# Patient Record
Sex: Male | Born: 1940 | Race: White | Hispanic: No | Marital: Married | Smoking: Former smoker
Health system: Southern US, Community
[De-identification: ages and names within clinical notes are randomized; demographics above are authoritative.]

## PROBLEM LIST (undated history)

## (undated) DIAGNOSIS — F32A Depression, unspecified: Secondary | ICD-10-CM

## (undated) DIAGNOSIS — G959 Disease of spinal cord, unspecified: Secondary | ICD-10-CM

## (undated) DIAGNOSIS — G4730 Sleep apnea, unspecified: Secondary | ICD-10-CM

## (undated) DIAGNOSIS — I499 Cardiac arrhythmia, unspecified: Secondary | ICD-10-CM

## (undated) DIAGNOSIS — E119 Type 2 diabetes mellitus without complications: Secondary | ICD-10-CM

## (undated) DIAGNOSIS — C801 Malignant (primary) neoplasm, unspecified: Secondary | ICD-10-CM

## (undated) DIAGNOSIS — M549 Dorsalgia, unspecified: Secondary | ICD-10-CM

## (undated) DIAGNOSIS — Z9889 Other specified postprocedural states: Secondary | ICD-10-CM

## (undated) DIAGNOSIS — M4712 Other spondylosis with myelopathy, cervical region: Secondary | ICD-10-CM

## (undated) DIAGNOSIS — J159 Unspecified bacterial pneumonia: Secondary | ICD-10-CM

## (undated) DIAGNOSIS — K219 Gastro-esophageal reflux disease without esophagitis: Secondary | ICD-10-CM

## (undated) DIAGNOSIS — I519 Heart disease, unspecified: Secondary | ICD-10-CM

## (undated) DIAGNOSIS — R4182 Altered mental status, unspecified: Secondary | ICD-10-CM

## (undated) DIAGNOSIS — R29898 Other symptoms and signs involving the musculoskeletal system: Secondary | ICD-10-CM

## (undated) DIAGNOSIS — J9621 Acute and chronic respiratory failure with hypoxia: Secondary | ICD-10-CM

## (undated) DIAGNOSIS — R519 Headache, unspecified: Secondary | ICD-10-CM

## (undated) DIAGNOSIS — I4891 Unspecified atrial fibrillation: Secondary | ICD-10-CM

## (undated) DIAGNOSIS — R112 Nausea with vomiting, unspecified: Secondary | ICD-10-CM

## (undated) DIAGNOSIS — F419 Anxiety disorder, unspecified: Secondary | ICD-10-CM

## (undated) DIAGNOSIS — I10 Essential (primary) hypertension: Secondary | ICD-10-CM

## (undated) DIAGNOSIS — M5412 Radiculopathy, cervical region: Secondary | ICD-10-CM

## (undated) DIAGNOSIS — I2510 Atherosclerotic heart disease of native coronary artery without angina pectoris: Secondary | ICD-10-CM

## (undated) DIAGNOSIS — F329 Major depressive disorder, single episode, unspecified: Secondary | ICD-10-CM

## (undated) DIAGNOSIS — R51 Headache: Secondary | ICD-10-CM

## (undated) DIAGNOSIS — G8929 Other chronic pain: Secondary | ICD-10-CM

## (undated) DIAGNOSIS — I482 Chronic atrial fibrillation, unspecified: Secondary | ICD-10-CM

## (undated) HISTORY — DX: Other chronic pain: G89.29

## (undated) HISTORY — PX: JOINT REPLACEMENT: SHX530

## (undated) HISTORY — PX: FRACTURE SURGERY: SHX138

## (undated) HISTORY — PX: OTHER SURGICAL HISTORY: SHX169

## (undated) HISTORY — DX: Dorsalgia, unspecified: M54.9

## (undated) HISTORY — PX: EYE SURGERY: SHX253

## (undated) HISTORY — PX: SHOULDER SURGERY: SHX246

## (undated) HISTORY — PX: CHOLECYSTECTOMY: SHX55

## (undated) HISTORY — DX: Depression, unspecified: F32.A

## (undated) HISTORY — PX: HERNIA REPAIR: SHX51

## (undated) HISTORY — PX: TONSILLECTOMY: SUR1361

## (undated) HISTORY — PX: APPENDECTOMY: SHX54

## (undated) HISTORY — DX: Unspecified atrial fibrillation: I48.91

## (undated) HISTORY — DX: Major depressive disorder, single episode, unspecified: F32.9

## (undated) HISTORY — DX: Other symptoms and signs involving the musculoskeletal system: R29.898

## (undated) HISTORY — DX: Heart disease, unspecified: I51.9

---

## 2014-03-16 DIAGNOSIS — M179 Osteoarthritis of knee, unspecified: Secondary | ICD-10-CM | POA: Insufficient documentation

## 2014-03-16 DIAGNOSIS — M5416 Radiculopathy, lumbar region: Secondary | ICD-10-CM | POA: Insufficient documentation

## 2014-03-16 DIAGNOSIS — M1710 Unilateral primary osteoarthritis, unspecified knee: Secondary | ICD-10-CM | POA: Insufficient documentation

## 2014-03-16 DIAGNOSIS — M5136 Other intervertebral disc degeneration, lumbar region: Secondary | ICD-10-CM | POA: Insufficient documentation

## 2014-03-16 DIAGNOSIS — M4310 Spondylolisthesis, site unspecified: Secondary | ICD-10-CM | POA: Insufficient documentation

## 2014-03-16 DIAGNOSIS — M47817 Spondylosis without myelopathy or radiculopathy, lumbosacral region: Secondary | ICD-10-CM | POA: Insufficient documentation

## 2014-03-16 DIAGNOSIS — M1610 Unilateral primary osteoarthritis, unspecified hip: Secondary | ICD-10-CM | POA: Insufficient documentation

## 2014-03-16 DIAGNOSIS — M545 Low back pain, unspecified: Secondary | ICD-10-CM | POA: Insufficient documentation

## 2014-03-16 DIAGNOSIS — M659 Synovitis and tenosynovitis, unspecified: Secondary | ICD-10-CM | POA: Insufficient documentation

## 2014-03-16 DIAGNOSIS — S93629A Sprain of tarsometatarsal ligament of unspecified foot, initial encounter: Secondary | ICD-10-CM | POA: Insufficient documentation

## 2014-03-16 DIAGNOSIS — I10 Essential (primary) hypertension: Secondary | ICD-10-CM | POA: Insufficient documentation

## 2014-03-16 DIAGNOSIS — Z9889 Other specified postprocedural states: Secondary | ICD-10-CM | POA: Insufficient documentation

## 2014-12-15 HISTORY — PX: SPINAL FUSION: SHX223

## 2014-12-25 ENCOUNTER — Other Ambulatory Visit: Payer: Self-pay | Admitting: Neurosurgery

## 2014-12-29 NOTE — Pre-Procedure Instructions (Signed)
OGDEN MALINA  12/29/2014   Your procedure is scheduled on: Tuesday, March 22nd   Report to [facility] Admitting at 9:30  AM.   Call this number if you have problems the morning of surgery: [phone]   Remember:   Do not eat food or drink liquids after midnight Monday.   Take these medicines the morning of surgery with A SIP OF WATER: Carvedilol, Prozac.              DO NOT TAKE your diabetes medication that morning.   Do not wear jewelry - no rings or watches.  Do not wear lotions or colognes.   You may NOT wear deodorant the day of surgery.   Men may shave face and neck.   Do not bring valuables to the hospital.  [facility] is not responsible for any belongings or valuables.               Contacts, dentures or bridgework may not be worn into surgery.  Leave suitcase in the car. After surgery it may be brought to your room.  For patients admitted to the hospital, discharge time is determined by your treatment team.    Name and phone number of your driver:    Special Instructions: "Preparing for Surgery" instruction sheet.   Please read over the following fact sheets that you were given: Pain Booklet, Coughing and Deep Breathing, Blood Transfusion Information, MRSA Information and Surgical Site Infection Prevention

## 2014-12-30 ENCOUNTER — Ambulatory Visit (HOSPITAL_COMMUNITY)
Admission: RE | Admit: 2014-12-30 | Discharge: 2014-12-30 | Disposition: A | Discharge status: Home / Self Care | Payer: Medicare Other | Source: Ambulatory Visit | Attending: Anesthesiology | Admitting: Anesthesiology

## 2014-12-30 ENCOUNTER — Encounter (HOSPITAL_COMMUNITY): Payer: Self-pay

## 2014-12-30 ENCOUNTER — Encounter (HOSPITAL_COMMUNITY)
Admission: RE | Admit: 2014-12-30 | Discharge: 2014-12-30 | Disposition: A | Discharge status: Home / Self Care | Payer: Medicare Other | Source: Ambulatory Visit | Attending: Neurosurgery | Admitting: Neurosurgery

## 2014-12-30 DIAGNOSIS — Z87891 Personal history of nicotine dependence: Secondary | ICD-10-CM | POA: Insufficient documentation

## 2014-12-30 DIAGNOSIS — M5134 Other intervertebral disc degeneration, thoracic region: Secondary | ICD-10-CM | POA: Insufficient documentation

## 2014-12-30 DIAGNOSIS — K219 Gastro-esophageal reflux disease without esophagitis: Secondary | ICD-10-CM | POA: Diagnosis not present

## 2014-12-30 DIAGNOSIS — M4310 Spondylolisthesis, site unspecified: Secondary | ICD-10-CM | POA: Diagnosis not present

## 2014-12-30 DIAGNOSIS — Z01818 Encounter for other preprocedural examination: Secondary | ICD-10-CM | POA: Insufficient documentation

## 2014-12-30 DIAGNOSIS — Z0183 Encounter for blood typing: Secondary | ICD-10-CM | POA: Diagnosis not present

## 2014-12-30 DIAGNOSIS — I10 Essential (primary) hypertension: Secondary | ICD-10-CM | POA: Insufficient documentation

## 2014-12-30 DIAGNOSIS — E119 Type 2 diabetes mellitus without complications: Secondary | ICD-10-CM | POA: Diagnosis not present

## 2014-12-30 DIAGNOSIS — I2510 Atherosclerotic heart disease of native coronary artery without angina pectoris: Secondary | ICD-10-CM

## 2014-12-30 DIAGNOSIS — Z01812 Encounter for preprocedural laboratory examination: Secondary | ICD-10-CM | POA: Diagnosis not present

## 2014-12-30 DIAGNOSIS — M419 Scoliosis, unspecified: Secondary | ICD-10-CM | POA: Insufficient documentation

## 2014-12-30 HISTORY — DX: Type 2 diabetes mellitus without complications: E11.9

## 2014-12-30 HISTORY — DX: Cardiac arrhythmia, unspecified: I49.9

## 2014-12-30 HISTORY — DX: Other specified postprocedural states: Z98.890

## 2014-12-30 HISTORY — DX: Nausea with vomiting, unspecified: R11.2

## 2014-12-30 HISTORY — DX: Atherosclerotic heart disease of native coronary artery without angina pectoris: I25.10

## 2014-12-30 HISTORY — DX: Malignant (primary) neoplasm, unspecified: C80.1

## 2014-12-30 HISTORY — DX: Gastro-esophageal reflux disease without esophagitis: K21.9

## 2014-12-30 HISTORY — DX: Essential (primary) hypertension: I10

## 2014-12-30 HISTORY — DX: Headache, unspecified: R51.9

## 2014-12-30 HISTORY — DX: Headache: R51

## 2014-12-30 HISTORY — DX: Anxiety disorder, unspecified: F41.9

## 2014-12-30 HISTORY — DX: Sleep apnea, unspecified: G47.30

## 2014-12-30 LAB — CBC
HCT: 41.6 % (ref 39.0–52.0)
Hemoglobin: 14.1 g/dL (ref 13.0–17.0)
MCH: 29.1 pg (ref 26.0–34.0)
MCHC: 33.9 g/dL (ref 30.0–36.0)
MCV: 85.8 fL (ref 78.0–100.0)
Platelets: 210 10*3/uL (ref 150–400)
RBC: 4.85 MIL/uL (ref 4.22–5.81)
RDW: 14.5 % (ref 11.5–15.5)
WBC: 9.1 10*3/uL (ref 4.0–10.5)

## 2014-12-30 LAB — SURGICAL PCR SCREEN
MRSA, PCR: NEGATIVE
STAPHYLOCOCCUS AUREUS: NEGATIVE

## 2014-12-30 LAB — BASIC METABOLIC PANEL
Anion gap: 10 (ref 5–15)
BUN: 13 mg/dL (ref 6–23)
CO2: 22 mmol/L (ref 19–32)
Calcium: 9.3 mg/dL (ref 8.4–10.5)
Chloride: 104 mmol/L (ref 96–112)
Creatinine, Ser: 0.99 mg/dL (ref 0.50–1.35)
GFR calc Af Amer: >90 mL/min (ref >90)
GFR calc non Af Amer: 80 mL/min — ABNORMAL LOW (ref >90)
GLUCOSE: 94 mg/dL (ref 70–99)
Potassium: 4.2 mmol/L (ref 3.5–5.1)
SODIUM: 136 mmol/L (ref 135–145)

## 2014-12-30 LAB — TYPE AND SCREEN
ABO/RH(D): O POS
ANTIBODY SCREEN: NEGATIVE

## 2014-12-30 LAB — ABO/RH: ABO/RH(D): O POS

## 2014-12-30 NOTE — Progress Notes (Addendum)
He sees a Dr. Claudie Leach over at [facility] in [facility].[phone])   LOV was end of Feb, first of march.  Had Echo, EkG, stress, doppler on legs and carotids. Have called Dr. De Blanch' office for this info.  Lorriane Shire at the office said they hadn't received it yet, so I have sent them a request. He also had sleep study 2 yrs ago @ [facility] and I have requested this report too. (His PCP is actually a PA by the name of [facility] @ [facility]).  DA Had right eye removed d/t cancer--wears prosthesis most all the time, even with his previous surgeries.  DA

## 2014-12-31 ENCOUNTER — Encounter (HOSPITAL_COMMUNITY): Payer: Self-pay

## 2014-12-31 NOTE — Progress Notes (Addendum)
Anesthesia Chart Review:  Patient is a 74 year old male scheduled for L5-S1 PLIF on 01/05/15 by Dr. Hal Neer.  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  Cardiologist is Dr. Claudie Leach with [facility].  He did not recommend any additional preoperative testing.  Meds include benazepril, Coreg, Prozac, HCTZ, Mg, metformin, Pravachol.  EKG pending.   By notes, recent telemetry monitor showed no afib/arrhythmias/heart block. No further dizziness off amlodipine.  12/30/14 CXR: Enlargement of cardiac silhouette. Minimal bronchitic changes without infiltrate.  Preoperative labs noted.    Clearance on chart, but awaiting additional cardiology records.  Chart will be left for follow-up.  George Hugh [facility]/Anesthesiology Phone [phone] 12/31/2014 5:31 PM  Addendum: Records received from [facility]. EKG is nearly impossible to read due to darkening. Interpretation seem to read as junctional rhythm with non-specific T wave abnormality.  I think I see at least some p waves but cannot be for certain due to the poor quality of the EKG faxed copy. I also cannot locate a date on the tracings sent. For these reasons, I think he will need an EKG on the day of surgery for a better baseline.  12/02/04 Echo: LV size, wall thickness and systolic function are normal. The diastolic filling pattern is normal for the age of the patient. The LA is markedly dilated. There is no evidence of pulmonary hypertension. Otherwise normal cardiac chamber sizes and function, normal valve anatomy and function, no pericardial effusion or intracardiac mass. Normal thoracic aorta and aortic arch.     Notes indicate that his last stress test was on 11/09/11, but no report was sent.  Because test was > 3 years ago and he has cardiac clearance, I will not pursue further.    If not acute changes then I would anticipate that he could proceed as planned.  George Hugh [facility] Short Stay Center/Anesthesiology Phone [phone] 01/01/2015 10:51 AM

## 2015-01-04 MED ORDER — DEXAMETHASONE SODIUM PHOSPHATE 10 MG/ML IJ SOLN
10.0000 mg | INTRAMUSCULAR | Status: AC
  Administered 2015-01-05: 10 mg via INTRAVENOUS
  Filled 2015-01-04: qty 1

## 2015-01-04 MED ORDER — CEFAZOLIN SODIUM-DEXTROSE 2-3 GM-% IV SOLR
2.0000 g | INTRAVENOUS | Status: AC
  Administered 2015-01-05: 2 g via INTRAVENOUS
  Filled 2015-01-04: qty 50

## 2015-01-04 NOTE — Progress Notes (Signed)
Left message for patient to arrive at 0730

## 2015-01-05 ENCOUNTER — Inpatient Hospital Stay (HOSPITAL_COMMUNITY): Payer: Medicare Other | Admitting: Vascular Surgery

## 2015-01-05 ENCOUNTER — Inpatient Hospital Stay (HOSPITAL_COMMUNITY)
Admission: RE | Admit: 2015-01-05 | Discharge: 2015-01-11 | DRG: 460 | Disposition: A | Discharge status: Home Health | Payer: Medicare Other | Source: Ambulatory Visit | Attending: Neurosurgery | Admitting: Neurosurgery

## 2015-01-05 ENCOUNTER — Inpatient Hospital Stay (HOSPITAL_COMMUNITY): Payer: Medicare Other | Admitting: Certified Registered"

## 2015-01-05 ENCOUNTER — Encounter (HOSPITAL_COMMUNITY)
Admission: RE | Disposition: A | Discharge status: Home Health | Payer: Self-pay | Source: Ambulatory Visit | Attending: Neurosurgery

## 2015-01-05 ENCOUNTER — Inpatient Hospital Stay (HOSPITAL_COMMUNITY): Payer: Medicare Other

## 2015-01-05 DIAGNOSIS — Z9001 Acquired absence of eye: Secondary | ICD-10-CM | POA: Diagnosis present

## 2015-01-05 DIAGNOSIS — Z87891 Personal history of nicotine dependence: Secondary | ICD-10-CM | POA: Diagnosis not present

## 2015-01-05 DIAGNOSIS — F419 Anxiety disorder, unspecified: Secondary | ICD-10-CM | POA: Diagnosis present

## 2015-01-05 DIAGNOSIS — E119 Type 2 diabetes mellitus without complications: Secondary | ICD-10-CM | POA: Diagnosis present

## 2015-01-05 DIAGNOSIS — M4317 Spondylolisthesis, lumbosacral region: Secondary | ICD-10-CM | POA: Diagnosis present

## 2015-01-05 DIAGNOSIS — G4730 Sleep apnea, unspecified: Secondary | ICD-10-CM | POA: Diagnosis present

## 2015-01-05 DIAGNOSIS — Z79899 Other long term (current) drug therapy: Secondary | ICD-10-CM | POA: Diagnosis not present

## 2015-01-05 DIAGNOSIS — I2510 Atherosclerotic heart disease of native coronary artery without angina pectoris: Secondary | ICD-10-CM | POA: Diagnosis present

## 2015-01-05 DIAGNOSIS — Z96643 Presence of artificial hip joint, bilateral: Secondary | ICD-10-CM | POA: Diagnosis present

## 2015-01-05 DIAGNOSIS — K219 Gastro-esophageal reflux disease without esophagitis: Secondary | ICD-10-CM | POA: Diagnosis present

## 2015-01-05 DIAGNOSIS — I10 Essential (primary) hypertension: Secondary | ICD-10-CM | POA: Diagnosis present

## 2015-01-05 DIAGNOSIS — M4316 Spondylolisthesis, lumbar region: Secondary | ICD-10-CM | POA: Diagnosis present

## 2015-01-05 DIAGNOSIS — M4326 Fusion of spine, lumbar region: Secondary | ICD-10-CM

## 2015-01-05 LAB — GLUCOSE, CAPILLARY
GLUCOSE-CAPILLARY: 132 mg/dL — AB (ref 70–99)
GLUCOSE-CAPILLARY: 162 mg/dL — AB (ref 70–99)
Glucose-Capillary: 114 mg/dL — ABNORMAL HIGH (ref 70–99)
Glucose-Capillary: 147 mg/dL — ABNORMAL HIGH (ref 70–99)

## 2015-01-05 SURGERY — POSTERIOR LUMBAR FUSION 1 LEVEL
Anesthesia: General | Site: Back

## 2015-01-05 MED ORDER — THROMBIN 20000 UNITS EX SOLR
CUTANEOUS | Status: DC | PRN
  Administered 2015-01-05 (×2): 20 mL via TOPICAL

## 2015-01-05 MED ORDER — MENTHOL 3 MG MT LOZG: 1.0000 | LOZENGE | OROMUCOSAL | Status: DC | PRN

## 2015-01-05 MED ORDER — ACETAMINOPHEN 650 MG RE SUPP: 650.0000 mg | RECTAL | Status: DC | PRN

## 2015-01-05 MED ORDER — LACTATED RINGERS IV SOLN
INTRAVENOUS | Status: DC
  Administered 2015-01-05: 10:00:00 via INTRAVENOUS

## 2015-01-05 MED ORDER — HYDROMORPHONE HCL 1 MG/ML IJ SOLN
0.2500 mg | INTRAMUSCULAR | Status: DC | PRN
  Administered 2015-01-05 (×4): 0.5 mg via INTRAVENOUS

## 2015-01-05 MED ORDER — BUPIVACAINE HCL (PF) 0.5 % IJ SOLN
INTRAMUSCULAR | Status: DC | PRN
  Administered 2015-01-05: 20 mL

## 2015-01-05 MED ORDER — SCOPOLAMINE 1 MG/3DAYS TD PT72
MEDICATED_PATCH | TRANSDERMAL | Status: AC
  Filled 2015-01-05: qty 1

## 2015-01-05 MED ORDER — METFORMIN HCL ER 500 MG PO TB24
500.0000 mg | ORAL_TABLET | Freq: Every day | ORAL | Status: DC
  Administered 2015-01-05 – 2015-01-10 (×6): 500 mg via ORAL
  Filled 2015-01-05 (×6): qty 1

## 2015-01-05 MED ORDER — SODIUM CHLORIDE 0.9 % IJ SOLN: 3.0000 mL | Freq: Two times a day (BID) | INTRAMUSCULAR | Status: DC

## 2015-01-05 MED ORDER — EPHEDRINE SULFATE 50 MG/ML IJ SOLN
INTRAMUSCULAR | Status: DC | PRN
  Administered 2015-01-05 (×3): 10 mg via INTRAVENOUS

## 2015-01-05 MED ORDER — ARTIFICIAL TEARS OP OINT
TOPICAL_OINTMENT | OPHTHALMIC | Status: AC
  Filled 2015-01-05: qty 3.5

## 2015-01-05 MED ORDER — SCOPOLAMINE 1 MG/3DAYS TD PT72SCOPOLAMINE 1 MG/3DAYS
MEDICATED_PATCH | TRANSDERMAL | Status: DC | PRN
Start: 2015-01-05 — End: 2015-01-05
  Administered 2015-01-05: 1 via TRANSDERMAL

## 2015-01-05 MED ORDER — METHOCARBAMOL 1000 MG/10ML IJ SOLN
500.0000 mg | Freq: Four times a day (QID) | INTRAVENOUS | Status: DC | PRN
  Filled 2015-01-05: qty 5

## 2015-01-05 MED ORDER — PROPOFOL 10 MG/ML IV BOLUS
INTRAVENOUS | Status: AC
  Filled 2015-01-05: qty 20

## 2015-01-05 MED ORDER — HYDROMORPHONE HCL 1 MG/ML IJ SOLN
INTRAMUSCULAR | Status: AC
  Filled 2015-01-05: qty 1

## 2015-01-05 MED ORDER — CARVEDILOL 12.5 MG PO TABS
12.5000 mg | ORAL_TABLET | Freq: Two times a day (BID) | ORAL | Status: DC
  Administered 2015-01-05 – 2015-01-11 (×12): 12.5 mg via ORAL
  Filled 2015-01-05 (×14): qty 1

## 2015-01-05 MED ORDER — CEFAZOLIN SODIUM-DEXTROSE 2-3 GM-% IV SOLR
2.0000 g | Freq: Three times a day (TID) | INTRAVENOUS | Status: AC
  Administered 2015-01-05 – 2015-01-06 (×2): 2 g via INTRAVENOUS
  Filled 2015-01-05 (×2): qty 50

## 2015-01-05 MED ORDER — PANTOPRAZOLE SODIUM 40 MG PO TBEC
40.0000 mg | DELAYED_RELEASE_TABLET | Freq: Every day | ORAL | Status: DC
  Administered 2015-01-05 – 2015-01-11 (×7): 40 mg via ORAL
  Filled 2015-01-05 (×7): qty 1

## 2015-01-05 MED ORDER — OXYCODONE-ACETAMINOPHEN 5-325 MG PO TABS
1.0000 | ORAL_TABLET | ORAL | Status: DC | PRN
  Administered 2015-01-05 – 2015-01-06 (×4): 2 via ORAL
  Filled 2015-01-05 (×3): qty 2

## 2015-01-05 MED ORDER — METHOCARBAMOL 500 MG PO TABS
500.0000 mg | ORAL_TABLET | Freq: Four times a day (QID) | ORAL | Status: DC | PRN
  Administered 2015-01-05 – 2015-01-09 (×7): 500 mg via ORAL
  Filled 2015-01-05 (×6): qty 1

## 2015-01-05 MED ORDER — ARTIFICIAL TEARS OP OINT
TOPICAL_OINTMENT | OPHTHALMIC | Status: DC | PRN
  Administered 2015-01-05: 1 via OPHTHALMIC

## 2015-01-05 MED ORDER — METHOCARBAMOL 500 MG PO TABS
ORAL_TABLET | ORAL | Status: AC
  Filled 2015-01-05: qty 1

## 2015-01-05 MED ORDER — ONDANSETRON HCL 4 MG/2ML IJ SOLN
INTRAMUSCULAR | Status: DC | PRN
  Administered 2015-01-05: 4 mg via INTRAVENOUS

## 2015-01-05 MED ORDER — SENNOSIDES-DOCUSATE SODIUM 8.6-50 MG PO TABS
1.0000 | ORAL_TABLET | Freq: Every evening | ORAL | Status: DC | PRN
  Filled 2015-01-05: qty 1

## 2015-01-05 MED ORDER — FENTANYL CITRATE 0.05 MG/ML IJ SOLN
INTRAMUSCULAR | Status: DC | PRN
  Administered 2015-01-05: 50 ug via INTRAVENOUS
  Administered 2015-01-05: 125 ug via INTRAVENOUS
  Administered 2015-01-05: 75 ug via INTRAVENOUS
  Administered 2015-01-05: 50 ug via INTRAVENOUS

## 2015-01-05 MED ORDER — 0.9 % SODIUM CHLORIDE (POUR BTL) OPTIME
TOPICAL | Status: DC | PRN
  Administered 2015-01-05: 1000 mL

## 2015-01-05 MED ORDER — SODIUM CHLORIDE 0.9 % IV SOLN: 250.0000 mL | INTRAVENOUS | Status: DC

## 2015-01-05 MED ORDER — ACETAMINOPHEN 325 MG PO TABS
650.0000 mg | ORAL_TABLET | ORAL | Status: DC | PRN
  Administered 2015-01-08: 650 mg via ORAL
  Filled 2015-01-05: qty 2

## 2015-01-05 MED ORDER — OXYCODONE-ACETAMINOPHEN 5-325 MG PO TABS
ORAL_TABLET | ORAL | Status: AC
  Filled 2015-01-05: qty 2

## 2015-01-05 MED ORDER — PANTOPRAZOLE SODIUM 40 MG IV SOLR
40.0000 mg | Freq: Every day | INTRAVENOUS | Status: DC
  Filled 2015-01-05: qty 40

## 2015-01-05 MED ORDER — INSULIN ASPART 100 UNIT/ML ~~LOC~~ SOLN
4.0000 [IU] | Freq: Three times a day (TID) | SUBCUTANEOUS | Status: DC
  Administered 2015-01-06 – 2015-01-11 (×11): 4 [IU] via SUBCUTANEOUS

## 2015-01-05 MED ORDER — LIDOCAINE HCL (CARDIAC) 20 MG/ML IV SOLN
INTRAVENOUS | Status: DC | PRN
  Administered 2015-01-05: 100 mg via INTRAVENOUS

## 2015-01-05 MED ORDER — BISACODYL 5 MG PO TBEC
5.0000 mg | DELAYED_RELEASE_TABLET | Freq: Every day | ORAL | Status: DC | PRN
  Administered 2015-01-08: 5 mg via ORAL
  Filled 2015-01-05 (×2): qty 1

## 2015-01-05 MED ORDER — SUCCINYLCHOLINE CHLORIDE 20 MG/ML IJ SOLN
INTRAMUSCULAR | Status: DC | PRN
  Administered 2015-01-05: 120 mg via INTRAVENOUS

## 2015-01-05 MED ORDER — ONDANSETRON HCL 4 MG/2ML IJ SOLN: 4.0000 mg | INTRAMUSCULAR | Status: DC | PRN

## 2015-01-05 MED ORDER — HYDROMORPHONE HCL 1 MG/ML IJ SOLN: 1.0000 mg | INTRAMUSCULAR | Status: DC | PRN

## 2015-01-05 MED ORDER — FLUOXETINE HCL 20 MG PO TABS
20.0000 mg | ORAL_TABLET | Freq: Every day | ORAL | Status: DC
  Administered 2015-01-05 – 2015-01-11 (×7): 20 mg via ORAL
  Filled 2015-01-05 (×11): qty 1

## 2015-01-05 MED ORDER — HYDROCHLOROTHIAZIDE 25 MG PO TABS
25.0000 mg | ORAL_TABLET | Freq: Every day | ORAL | Status: DC
  Administered 2015-01-05 – 2015-01-11 (×7): 25 mg via ORAL
  Filled 2015-01-05 (×7): qty 1

## 2015-01-05 MED ORDER — MIDAZOLAM HCL 2 MG/2ML IJ SOLN
INTRAMUSCULAR | Status: AC
  Filled 2015-01-05: qty 2

## 2015-01-05 MED ORDER — FENTANYL CITRATE 0.05 MG/ML IJ SOLN
INTRAMUSCULAR | Status: AC
Start: 2015-01-05 — End: 2015-01-05
  Filled 2015-01-05: qty 5

## 2015-01-05 MED ORDER — ONDANSETRON HCL 4 MG/2ML IJ SOLN
INTRAMUSCULAR | Status: AC
  Filled 2015-01-05: qty 2

## 2015-01-05 MED ORDER — PHENOL 1.4 % MT LIQD
1.0000 | OROMUCOSAL | Status: DC | PRN
  Administered 2015-01-07: 1 via OROMUCOSAL
  Filled 2015-01-05: qty 177

## 2015-01-05 MED ORDER — POTASSIUM CHLORIDE IN NACL 20-0.45 MEQ/L-% IV SOLN
INTRAVENOUS | Status: DC
  Filled 2015-01-05 (×13): qty 1000

## 2015-01-05 MED ORDER — INSULIN ASPART 100 UNIT/ML ~~LOC~~ SOLN: 0.0000 [IU] | Freq: Every day | SUBCUTANEOUS | Status: DC

## 2015-01-05 MED ORDER — NEOSTIGMINE METHYLSULFATE 10 MG/10ML IV SOLN
INTRAVENOUS | Status: DC | PRN
  Administered 2015-01-05: 4 mg via INTRAVENOUS

## 2015-01-05 MED ORDER — PROPOFOL 10 MG/ML IV BOLUS
INTRAVENOUS | Status: DC | PRN
  Administered 2015-01-05: 200 mg via INTRAVENOUS

## 2015-01-05 MED ORDER — ROCURONIUM BROMIDE 50 MG/5ML IV SOLN
INTRAVENOUS | Status: AC
  Filled 2015-01-05: qty 1

## 2015-01-05 MED ORDER — PHENYLEPHRINE HCL 10 MG/ML IJ SOLN
10.0000 mg | INTRAVENOUS | Status: DC | PRN
  Administered 2015-01-05: 20 ug/min via INTRAVENOUS

## 2015-01-05 MED ORDER — GLYCOPYRROLATE 0.2 MG/ML IJ SOLN
INTRAMUSCULAR | Status: DC | PRN
  Administered 2015-01-05: 0.6 mg via INTRAVENOUS

## 2015-01-05 MED ORDER — ROCURONIUM BROMIDE 100 MG/10ML IV SOLN
INTRAVENOUS | Status: DC | PRN
  Administered 2015-01-05: 10 mg via INTRAVENOUS
  Administered 2015-01-05: 50 mg via INTRAVENOUS
  Administered 2015-01-05: 10 mg via INTRAVENOUS

## 2015-01-05 MED ORDER — PROMETHAZINE HCL 25 MG/ML IJ SOLN: 6.2500 mg | INTRAMUSCULAR | Status: DC | PRN

## 2015-01-05 MED ORDER — SODIUM CHLORIDE 0.9 % IJ SOLN: 3.0000 mL | INTRAMUSCULAR | Status: DC | PRN

## 2015-01-05 MED ORDER — SODIUM CHLORIDE 0.9 % IR SOLN
Status: DC | PRN
  Administered 2015-01-05: 500 mL

## 2015-01-05 MED ORDER — INSULIN ASPART 100 UNIT/ML ~~LOC~~ SOLN
0.0000 [IU] | Freq: Three times a day (TID) | SUBCUTANEOUS | Status: DC
  Administered 2015-01-06 – 2015-01-11 (×4): 2 [IU] via SUBCUTANEOUS

## 2015-01-05 MED ORDER — BENAZEPRIL HCL 20 MG PO TABS
40.0000 mg | ORAL_TABLET | Freq: Every day | ORAL | Status: DC
Start: 2015-01-05 — End: 2015-01-11
  Administered 2015-01-05 – 2015-01-11 (×7): 40 mg via ORAL
  Filled 2015-01-05: qty 2
  Filled 2015-01-05: qty 1
  Filled 2015-01-05 (×3): qty 2
  Filled 2015-01-05 (×2): qty 1

## 2015-01-05 MED ORDER — LACTATED RINGERS IV SOLN
INTRAVENOUS | Status: DC | PRN
  Administered 2015-01-05 (×2): via INTRAVENOUS

## 2015-01-05 SURGICAL SUPPLY — 68 items
BAG DECANTER FOR FLEXI CONT (MISCELLANEOUS) ×3 IMPLANT
BENZOIN TINCTURE PRP APPL 2/3 (GAUZE/BANDAGES/DRESSINGS) ×3 IMPLANT
BLADE CLIPPER SURG (BLADE) IMPLANT
BONE EQUIVA 10CC (Bone Implant) ×3 IMPLANT
BRUSH SCRUB EZ PLAIN DRY (MISCELLANEOUS) ×3 IMPLANT
BUR CUTTER 7.0 ROUND (BURR) ×3 IMPLANT
BUR MATCHSTICK NEURO 3.0 LAGG (BURR) ×3 IMPLANT
CAGE PEEK OPTIMA ARDIS 11X9X26 (Cage) ×6 IMPLANT
CANISTER SUCT 3000ML PPV (MISCELLANEOUS) ×3 IMPLANT
CLOSURE WOUND 1/2 X4 (GAUZE/BANDAGES/DRESSINGS) ×2
CONT SPEC 4OZ CLIKSEAL STRL BL (MISCELLANEOUS) ×6 IMPLANT
COVER BACK TABLE 60X90IN (DRAPES) ×3 IMPLANT
DRAPE C-ARM 42X72 X-RAY (DRAPES) ×6 IMPLANT
DRAPE LAPAROTOMY 100X72X124 (DRAPES) ×3 IMPLANT
DRAPE SURG 17X23 STRL (DRAPES) ×6 IMPLANT
DRSG OPSITE 4X5.5 SM (GAUZE/BANDAGES/DRESSINGS) ×3 IMPLANT
DRSG OPSITE POSTOP 4X6 (GAUZE/BANDAGES/DRESSINGS) ×3 IMPLANT
DRSG OPSITE POSTOP 4X8 (GAUZE/BANDAGES/DRESSINGS) ×3 IMPLANT
DRSG TELFA 3X8 NADH (GAUZE/BANDAGES/DRESSINGS) ×3 IMPLANT
DURAPREP 26ML APPLICATOR (WOUND CARE) ×3 IMPLANT
ELECT REM PT RETURN 9FT ADLT (ELECTROSURGICAL) ×3
ELECTRODE REM PT RTRN 9FT ADLT (ELECTROSURGICAL) ×1 IMPLANT
GAUZE SPONGE 4X4 12PLY STRL (GAUZE/BANDAGES/DRESSINGS) ×3 IMPLANT
GAUZE SPONGE 4X4 16PLY XRAY LF (GAUZE/BANDAGES/DRESSINGS) IMPLANT
GLOVE BIOGEL PI IND STRL 7.0 (GLOVE) ×4 IMPLANT
GLOVE BIOGEL PI INDICATOR 7.0 (GLOVE) ×8
GLOVE ECLIPSE 6.5 STRL STRAW (GLOVE) ×3 IMPLANT
GLOVE ECLIPSE 8.0 STRL XLNG CF (GLOVE) ×6 IMPLANT
GLOVE EXAM NITRILE LRG STRL (GLOVE) IMPLANT
GLOVE EXAM NITRILE MD LF STRL (GLOVE) IMPLANT
GLOVE EXAM NITRILE XS STR PU (GLOVE) IMPLANT
GLOVE SS N UNI LF 7.0 STRL (GLOVE) ×12 IMPLANT
GOWN STRL REUS W/ TWL LRG LVL3 (GOWN DISPOSABLE) ×1 IMPLANT
GOWN STRL REUS W/ TWL XL LVL3 (GOWN DISPOSABLE) ×4 IMPLANT
GOWN STRL REUS W/TWL 2XL LVL3 (GOWN DISPOSABLE) IMPLANT
GOWN STRL REUS W/TWL LRG LVL3 (GOWN DISPOSABLE) ×2
GOWN STRL REUS W/TWL XL LVL3 (GOWN DISPOSABLE) ×8
HANDLE PEDIGUARD CANNULATED (INSTRUMENTS) ×3 IMPLANT
K-WIRE NITHNOL TROCAR TIP (WIRE) ×12 IMPLANT
KIT BASIN OR (CUSTOM PROCEDURE TRAY) ×3 IMPLANT
KIT ROOM TURNOVER OR (KITS) ×3 IMPLANT
LIQUID BAND (GAUZE/BANDAGES/DRESSINGS) IMPLANT
NEEDLE 1 PEDIGUARD CANNULATED (NEEDLE) ×6 IMPLANT
NEEDLE HYPO 22GX1.5 SAFETY (NEEDLE) ×3 IMPLANT
NS IRRIG 1000ML POUR BTL (IV SOLUTION) ×3 IMPLANT
PACK LAMINECTOMY NEURO (CUSTOM PROCEDURE TRAY) ×3 IMPLANT
PAD ARMBOARD 7.5X6 YLW CONV (MISCELLANEOUS) ×9 IMPLANT
PATTIES SURGICAL .75X.75 (GAUZE/BANDAGES/DRESSINGS) IMPLANT
ROD BENT PERC 35MM (Rod) ×6 IMPLANT
SCREW MIN INVASIVE 6.5X35 (Screw) ×6 IMPLANT
SCREW POLYAXIA MIS 6.5X40MM (Screw) ×6 IMPLANT
SHEATH PAT (SHEATH) ×3 IMPLANT
SPONGE LAP 4X18 X RAY DECT (DISPOSABLE) IMPLANT
SPONGE SURGIFOAM ABS GEL 100 (HEMOSTASIS) ×3 IMPLANT
STRIP CLOSURE SKIN 1/2X4 (GAUZE/BANDAGES/DRESSINGS) ×4 IMPLANT
SUT PROLENE 0 CT 1 30 (SUTURE) IMPLANT
SUT VIC AB 0 CT1 18XCR BRD8 (SUTURE) ×1 IMPLANT
SUT VIC AB 0 CT1 8-18 (SUTURE) ×2
SUT VIC AB 2-0 OS6 18 (SUTURE) ×9 IMPLANT
SUT VIC AB 3-0 CP2 18 (SUTURE) ×3 IMPLANT
SYR 20ML ECCENTRIC (SYRINGE) ×3 IMPLANT
TAPE STRIPS DRAPE STRL (GAUZE/BANDAGES/DRESSINGS) ×3 IMPLANT
TOP CLSR SEQUOIA (Orthopedic Implant) ×12 IMPLANT
TOWEL OR 17X24 6PK STRL BLUE (TOWEL DISPOSABLE) ×3 IMPLANT
TOWEL OR 17X26 10 PK STRL BLUE (TOWEL DISPOSABLE) ×3 IMPLANT
TRAP SPECIMEN MUCOUS 40CC (MISCELLANEOUS) ×3 IMPLANT
TRAY FOLEY CATH 14FRSI W/METER (CATHETERS) ×3 IMPLANT
WATER STERILE IRR 1000ML POUR (IV SOLUTION) ×3 IMPLANT

## 2015-01-05 NOTE — Anesthesia Procedure Notes (Signed)
Procedure Name: Intubation Date/Time: 01/05/2015 10:50 AM Performed by: Trixie Deis A Pre-anesthesia Checklist: Patient identified, Timeout performed, Emergency Drugs available, Suction available and Patient being monitored Patient Re-evaluated:Patient Re-evaluated prior to inductionOxygen Delivery Method: Circle system utilized Preoxygenation: Pre-oxygenation with 100% oxygen Intubation Type: IV induction Ventilation: Mask ventilation without difficulty and Oral airway inserted - appropriate to patient size Grade View: Grade I Tube type: Oral Tube size: 7.5 mm Number of attempts: 1 Airway Equipment and Method: Video-laryngoscopy and Rigid stylet Placement Confirmation: ETT inserted through vocal cords under direct vision,  breath sounds checked- equal and bilateral and positive ETCO2 Secured at: 23 cm Tube secured with: Tape Dental Injury: Teeth and Oropharynx as per pre-operative assessment

## 2015-01-05 NOTE — Progress Notes (Signed)
Utilization review completed.  

## 2015-01-05 NOTE — H&P (Signed)
Roger Nelson is an 74 y.o. male.   Chief Complaint: Spondylolisthesis L5-S1 HPI: The patient is a 74 year old gentleman who is evaluated back in late 2014 for back pain with radiation to the legs morselized the left than the right. He said this problem for a number of years. He eventually got an MRI scan of his lumbar spine and was evaluated in [place] 2014. He has trouble walking and that make the pain quite severe that he has to rest. He says the leading 4 helps when he is at the store. He's tried some oral prednisone which he is some temporary relief but nothing sustained. After evaluation in late 2014 he tried a variety of conservative therapies and 2015 including epidural steroid shots. These gave him no significant relief. He'll return to see me early this year and we repeated the MRI scan which x-ray showed progression of the spondylolisthesis with worsening nerve compression in the foramen. After discussing the options the patient requested surgery now comes for an L5-S1 decompression with interbody fusion and pedicle screw fixation. I have had a long discussion with him regarding the risks and benefits of surgical intervention. The risks discussed include but are not limited to bleeding infection weakness some as paralysis spinal fluid leak trouble with instrumentation nonunion coma and death. We have discussed alternative methods of therapy along the risks and benefits of nonintervention. He's had the opportunity to rest numerous questions and appears to understand. With this information in hand he has requested we proceed with surgery.  Past Medical History  Diagnosis Date  . PONV (postoperative nausea and vomiting)   . Hypertension   . Diabetes mellitus without complication     borderline  . Anxiety     having panic attacks awhile ago  . GERD (gastroesophageal reflux disease)     takes OTC every now and then  . Headache     h/o migraines--last one was 6 mths  . Dysrhythmia      palpitations caused by HCTZ  . Sleep apnea     tested about 2 yrs ago. has bipap  . Cancer     melanoma in his eye right--wears prosthetic  . Coronary artery disease     found out about it 6-7 yrs ago. [facility] in [place]    Past Surgical History  Procedure Laterality Date  . Joint replacement      bilateral hips  . Tonsillectomy    . Fracture surgery      right arm  . Appendectomy    . Cholecystectomy    . Hernia repair      umbilical  . Colon was twisted      medically treated  . Eye surgery      right eye removed d/t cancer    No family history on file. Social History:  reports that he has quit smoking. He does not have any smokeless tobacco history on file. He reports that he does not drink alcohol or use illicit drugs.  Allergies: No Known Allergies  Medications Prior to Admission  Medication Sig Dispense Refill  . benazepril (LOTENSIN) 40 MG tablet Take 40 mg by mouth daily.    . Calcium Carb-Cholecalciferol 600-200 MG-UNIT TABS Take 2 tablets by mouth daily.    . carvedilol (COREG) 12.5 MG tablet Take 12.5 mg by mouth 2 (two) times daily with a meal.    . FLUoxetine (PROZAC) 20 MG tablet Take 20 mg by mouth daily.    . hydrochlorothiazide (HYDRODIURIL)  25 MG tablet Take 25 mg by mouth daily.    . Magnesium 200 MG TABS Take 400 mg by mouth daily.    . metFORMIN (GLUCOPHAGE-XR) 500 MG 24 hr tablet Take 500 mg by mouth daily.  0  . pravastatin (PRAVACHOL) 40 MG tablet Take 40 mg by mouth daily.  0    No results found for this or any previous visit (from the past 48 hour(s)). No results found.  A comprehensive review of systems was negative.  Blood pressure 179/96, pulse 66, temperature 98.6 F (37 C), temperature source Oral, resp. rate 20, height 5\' 6"  (1.676 m), weight 100.5 kg (221 lb 9 oz), SpO2 96 %.  The patient is awake alert and oriented. There is no facial asymmetry. He has decreased reflexes. Strength and sensation are  intact. 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 The plan is for an L5-S1 posterior lumbar interbody fusion.  Faythe Ghee, MD 01/05/2015, 9:55 AM

## 2015-01-05 NOTE — Anesthesia Postprocedure Evaluation (Signed)
  Anesthesia Post-op Note  Patient: Roger Nelson  Procedure(s) Performed: Procedure(s) with comments: POSTERIOR LUMBAR FUSION 1 LEVEL (N/A) - POSTERIOR LUMBAR FUSION 1 LEVEL L5-S1  Patient Location: PACU  Anesthesia Type:General  Level of Consciousness: awake  Airway and Oxygen Therapy: Patient Spontanous Breathing  Post-op Pain: mild  Post-op Assessment: Post-op Vital signs reviewed  Post-op Vital Signs: Reviewed  Last Vitals:  Filed Vitals:   01/05/15 1659  BP: 157/83  Pulse: 76  Temp: 36.7 C  Resp: 16    Complications: No apparent anesthesia complications

## 2015-01-05 NOTE — Op Note (Signed)
Preop diagnosis: Spondylolysis L5 with lytic spondylolisthesis L5-S1 with bilateral L5 nerve root compression Postop diagnosis: Same Procedure: L5-S1 Gill procedure with decompression of L5 and S1 nerve roots more so than needed for interbody fusion Bilateral L5-S1 microdiscectomy L5-S1 posterior lumbar interbody fusion with peek interbody spacer L5-S1 posterolateral fusion L5-S1 nonsegmental pedicle screw instrumentation with Pathfinder percutaneous pedicle screw system Surgeon: Kayliah Tindol Asst.: Vertell Limber  After being placed the prone position the patient's back was prepped and draped in the usual sterile fashion. Midline incision was made above the spinous processes of L5 and S1. Using Bovie cutting current the incision was carried on the spinous processes. Subperiosteal dissection was then carried out bilaterally on the spinous processes lamina facet joint and subtalar tract was placed for exposure. X-ray showed approach the appropriate level. Using the Leksell rongeur the spinous processes of L5 and S1 removed. The complete free-floating lamina an inferior facet of L5 was removed along with the free piece of bone and reactive tissue that was compressing the L5 nerve root bilaterally. We then remove the superior facet of S1 to complete the foraminal decompression. L5 and S1 nerve roots were well identified and decompressed followed at the foramen without difficulty. We then entered the disc space bilaterally and thoroughly cleaned out the disc space without to injury to the neural elements and this was successfully done. We then prepared the disc space for interbody fusion. We distracted up to an 11 mm size and felt this was a good choice. We then chose to 11 x 9 x 26 mm cages and filled with a mixture of autologous bone and morselized allograft. After thoroughly preparing the disc for interbody fusion was impacted the first graft. Prior to placing the second graft we placed a mixture of autologous bone and  morselized allograft deep within the interspace to help with the interbody fusion. We then impacted a second cage without difficulty. Prior to closing the dorsal lumbar fascia we decorticated the far lateral region and posterior lateral fusion with the talus bone and morselized allograft. Dorsal lumbar fascia placed percutaneous pedicle screws through the fascia laterally. We passed a Jamshidi needle through the pedicle L5 and S1 bilaterally and placed guidewires with him. We tapped with 6 Miller tap and then placed 6.5 x 40 Miller screws at L5 bilaterally and 6.5 x 35 mm screws at S1 bilaterally. We then passed rods down the towers secured them to the top of the screws and did tightening and final tightening with torque and counter torque. The towers were then removed and final fluoroscopy in AP lateral direction looked excellent. We then irrigated these 2 incisions and closed with 0 Vicryl. Left an drain in the suprafascial space then closed the soft tissue with Vicryl and then a running Prolene on the skin. A sterile dressing was then applied and the patient was extubated and taken to recovery room in stable condition.

## 2015-01-05 NOTE — Transfer of Care (Signed)
Immediate Anesthesia Transfer of Care Note  Patient: Roger Nelson  Procedure(s) Performed: Procedure(s) with comments: POSTERIOR LUMBAR FUSION 1 LEVEL (N/A) - POSTERIOR LUMBAR FUSION 1 LEVEL L5-S1  Patient Location: PACU  Anesthesia Type:General  Level of Consciousness: awake, alert  and patient cooperative  Airway & Oxygen Therapy: Patient Spontanous Breathing and Patient connected to face mask oxygen  Post-op Assessment: Report given to RN, Post -op Vital signs reviewed and stable and Patient moving all extremities  Post vital signs: Reviewed and stable  Last Vitals:  Filed Vitals:   01/05/15 1511  BP: 144/63  Pulse: 82  Temp:   Resp: 27    Complications: No apparent anesthesia complications

## 2015-01-05 NOTE — Progress Notes (Signed)
Orthopedic Tech Progress Note Patient Details:  Roger Nelson [birth year] [id number] Called brace order in to [employer]. Patient ID: Roger Nelson, male   DOB: 1941/10/02, 74 y.o.   MRN: [id number]   Roger Nelson 01/05/2015, 6:04 PM

## 2015-01-05 NOTE — Anesthesia Preprocedure Evaluation (Addendum)
Anesthesia Evaluation  Patient identified by MRN, date of birth, ID band  Reviewed: Allergy & Precautions, NPO status   History of Anesthesia Complications (+) PONV and history of anesthetic complications  Airway Mallampati: III  TM Distance: <3 FB Neck ROM: Full    Dental  (+) Teeth Intact, Dental Advisory Given   Pulmonary sleep apnea , former smoker,          Cardiovascular hypertension, Pt. on home beta blockers + CAD + dysrhythmias     Neuro/Psych  Headaches, R artificial eye    GI/Hepatic Neg liver ROS, GERD-  ,  Endo/Other  diabetes, Type 2  Renal/GU negative Renal ROS     Musculoskeletal   Abdominal   Peds  Hematology   Anesthesia Other Findings   Reproductive/Obstetrics                          Anesthesia Physical Anesthesia Plan  ASA: III  Anesthesia Plan: General   Post-op Pain Management:    Induction: Intravenous  Airway Management Planned: Oral ETT  Additional Equipment:   Intra-op Plan:   Post-operative Plan: Extubation in OR  Informed Consent: I have reviewed the patients History and Physical, chart, labs and discussed the procedure including the risks, benefits and alternatives for the proposed anesthesia with the patient or authorized representative who has indicated his/her understanding and acceptance.     Plan Discussed with: CRNA and Anesthesiologist  Anesthesia Plan Comments:         Anesthesia Quick Evaluation

## 2015-01-06 LAB — GLUCOSE, CAPILLARY
GLUCOSE-CAPILLARY: 134 mg/dL — AB (ref 70–99)
GLUCOSE-CAPILLARY: 97 mg/dL (ref 70–99)
Glucose-Capillary: 123 mg/dL — ABNORMAL HIGH (ref 70–99)
Glucose-Capillary: 120 mg/dL — ABNORMAL HIGH (ref 70–99)

## 2015-01-06 MED ORDER — HYDROCODONE-ACETAMINOPHEN 5-325 MG PO TABS
1.0000 | ORAL_TABLET | ORAL | Status: DC | PRN
  Administered 2015-01-07: 1 via ORAL
  Administered 2015-01-07 – 2015-01-08 (×4): 2 via ORAL
  Administered 2015-01-08: 1 via ORAL
  Administered 2015-01-09 – 2015-01-11 (×7): 2 via ORAL
  Filled 2015-01-06 (×10): qty 2
  Filled 2015-01-06 (×2): qty 1
  Filled 2015-01-06: qty 2

## 2015-01-06 NOTE — Evaluation (Addendum)
Occupational Therapy Evaluation Patient Details Name: Roger Nelson MRN: [id number] DOB: 1941-10-04 Today's Date: 01/06/2015    History of Present Illness 74 y.o. s/p POSTERIOR LUMBAR FUSION 1 LEVEL L5-S1.   Clinical Impression   Pt s/p above. Pt requiring assist with socks, PTA. Feel pt will benefit from acute OT to increase independence and reinforce back precautions prior to d/c. Plan to practice LB dressing with AE, shower transfer, and reinforce precautions during ADL/functional activities next session.    Follow Up Recommendations  No OT follow up;Supervision/Assistance - 24 hour    Equipment Recommendations  3 in 1 bedside comode;Other (comment) (long sponge)    Recommendations for Other Services       Precautions / Restrictions Precautions Precautions: Back;Fall Precaution Booklet Issued: Yes (comment) Precaution Comments: educated on back precautions Required Braces or Orthoses: Spinal Brace Spinal Brace: Lumbar corset;Applied in sitting position (brace already on in session) Restrictions Weight Bearing Restrictions: No      Mobility Bed Mobility               General bed mobility comments: not assessed  Transfers Overall transfer level: Needs assistance Equipment used: Rolling walker (2 wheeled) Transfers: Sit to/from Stand Sit to Stand: Min guard         General transfer comment: cues for hand placement/technique. Pt had difficulty following commands    Balance    Min guard for ambulation with RW.                                        ADL Overall ADL's : Needs assistance/impaired                     Lower Body Dressing: Minimal assistance;With adaptive equipment;Sit to/from stand   Toilet Transfer: Min guard;Ambulation;RW (chair)           Functional mobility during ADLs: Min guard;Rolling walker General ADL Comments: Educated on AE and pt practiced doffing sock with reacher-verbalized he knows how to use  sockaid. Educated on use of bag on walker. Educated on back brace (clothing underneath and no sleeping in it-did not practice donning/doffing brace in session. Educated on use of cup for oral care and placement of grooming items to avoid breaking precautions. Wrote down some information for pt on back handout.  Educated on positioning of pillows.     Vision  Pt with drooping eyelid on right eye. Pt has prosthetic right eye at baseline.   Perception     Praxis      Pertinent Vitals/Pain Pain Assessment: 0-10 Pain Score: 8  Pain Location: right leg Pain Intervention(s): Monitored during session;Limited activity within patient's tolerance     Hand Dominance Right   Extremity/Trunk Assessment Upper Extremity Assessment Upper Extremity Assessment: Overall WFL for tasks assessed   Lower Extremity Assessment Lower Extremity Assessment: Defer to PT evaluation       Communication Communication Communication: No difficulties   Cognition Arousal/Alertness: Lethargic Behavior During Therapy: WFL for tasks assessed/performed Overall Cognitive Status: No family/caregiver present to determine baseline cognitive functioning (difficulty following commands; slow processing;)       Memory: Decreased short-term memory;Decreased recall of precautions             General Comments       Exercises       Shoulder Instructions      Home Living Family/patient expects to be  discharged to:: Private residence Living Arrangements: Spouse/significant other;Children Available Help at Discharge: Family;Available 24 hours/day Type of Home: House Home Access: Stairs to enter [employer] of Steps: 2 Entrance Stairs-Rails: Left Home Layout: Two level;Able to live on main level with bedroom/bathroom     Bathroom Shower/Tub: Occupational psychologist: Handicapped height     Home Equipment: Adaptive equipment;Cane - single point;Walker - 2 wheels (reports he has access to  shower chair) Adaptive Equipment: Reacher;Sock aid;Long-handled shoe horn        Prior Functioning/Environment Level of Independence: Needs assistance    ADL's / Homemaking Assistance Needed: assist with socks        OT Diagnosis: Acute pain   OT Problem List: Decreased cognition;Decreased safety awareness;Decreased knowledge of use of DME or AE;Decreased knowledge of precautions;Pain;Decreased strength;Decreased range of motion;Decreased activity tolerance   OT Treatment/Interventions: Self-care/ADL training;DME and/or AE instruction;Therapeutic activities;Cognitive remediation/compensation;Balance training;Patient/family education    OT Goals(Current goals can be found in the care plan section) Acute Rehab OT Goals Patient Stated Goal: not stated OT Goal Formulation: With patient Time For Goal Achievement: 01/13/15 Potential to Achieve Goals: Good ADL Goals Pt Will Perform Lower Body Dressing: with set-up;with supervision;with adaptive equipment;sit to/from stand Pt Will Transfer to Toilet: ambulating;with modified independence Pt Will Perform Toileting - Clothing Manipulation and hygiene: with modified independence;sit to/from stand Pt Will Perform Tub/Shower Transfer: Shower transfer;with supervision;ambulating;rolling walker;shower seat Additional ADL Goal #1: Pt will independently verbalize 3/3 back precautions and maintain during ADLs/functional activities. Additional ADL Goal #2: Pt will don/doff back brace with setup A.  OT Frequency: Min 2X/week   Barriers to D/C:            Co-evaluation              End of Session Equipment Utilized During Treatment: Gait belt;Rolling walker;Back brace Nurse Communication: Mobility status;Other (comment) (cognition and DME recommendation)  Activity Tolerance: Patient limited by pain Patient left: in chair;with call bell/phone within reach   Time: 1101-1115 OT Time Calculation (min): 14 min Charges:  OT General  Charges $OT Visit: 1 Procedure OT Evaluation $Initial OT Evaluation Tier I: 1 Procedure G-CodesBenito Mccreedy OTR/L [id number] 01/06/2015, 11:37 AM

## 2015-01-06 NOTE — Progress Notes (Signed)
Patient ID: Roger Nelson, male   DOB: Dec 18, 1940, 74 y.o.   MRN: [id number] Afeb, vss Slowly increasing activity. Wound clean.Drain working well Will increase activity, and hopefully d/c in next day or so.

## 2015-01-06 NOTE — Evaluation (Signed)
Physical Therapy Evaluation Patient Details Name: Roger Nelson MRN: [id number] DOB: 1941/09/29 Today's Date: 01/06/2015   History of Present Illness  74 y.o. s/p POSTERIOR LUMBAR FUSION 1 LEVEL L5-S1.  Clinical Impression  Pt admitted with above diagnosis. Pt currently with functional limitations due to the deficits listed below (see PT Problem List). Pt needs a lot of reenforcement of precautions as he is slow to process information and appears a little confused.  States he will have wife to help at home. Will need HHPT and 24 hour care.  States he has equipment.   Pt will benefit from skilled PT to increase their independence and safety with mobility to allow discharge to the venue listed below.      Follow Up Recommendations Home health PT;Supervision/Assistance - 24 hour    Equipment Recommendations  None recommended by PT    Recommendations for Other Services       Precautions / Restrictions Precautions Precautions: Back;Fall Precaution Booklet Issued: Yes (comment) Precaution Comments: educated on back precautions Required Braces or Orthoses: Spinal Brace Spinal Brace: Lumbar corset;Applied in sitting position (brace already on in session but reviewed tightening brace) Restrictions Weight Bearing Restrictions: No      Mobility  Bed Mobility               General bed mobility comments: not assessed  Transfers Overall transfer level: Needs assistance Equipment used: Rolling walker (2 wheeled) Transfers: Sit to/from Stand Sit to Stand: Min assist         General transfer comment: cues for hand placement/technique. Pt had difficulty following commands  Ambulation/Gait Ambulation/Gait assistance: Min assist;Mod assist;+2 safety/equipment Ambulation Distance (Feet): 110 Feet Assistive device: Rolling walker (2 wheeled) Gait Pattern/deviations: Step-through pattern;Decreased stride length;Wide base of support;Trunk flexed;Drifts right/left   Gait velocity  interpretation: Below normal speed for age/gender General Gait Details: Pt initially ambulating well with RW.  Once pt began to fatigue pt could not keep RW close and had a LOB needing mod assist to recover.  HAd to get a wheelchair for pt to rest in  and brought pt back to room in wheelchair.  Pt had difficulty when not ambulating with RW needing HHA of 2 for safety as pt very unsteady without bil UE support.  Poor overall safety with RW as well especially when fatigued.  Was going to practice steps but was unable secondary to pt too tired.  Pts posture is flexed and worsens the more fatigued he gets.    Stairs            Wheelchair Mobility    Modified Rankin (Stroke Patients Only)       Balance Overall balance assessment: Needs assistance;History of Falls       Postural control: Posterior lean Standing balance support: Bilateral upper extremity supported;During functional activity Standing balance-Leahy Scale: Poor Standing balance comment: Relies heavily on UE support.                               Pertinent Vitals/Pain Pain Assessment: 0-10 Pain Score: 8  Pain Location: right leg Pain Descriptors / Indicators: Aching Pain Intervention(s): Limited activity within patient's tolerance;Monitored during session;Repositioned;Premedicated before session  VSS    Home Living Family/patient expects to be discharged to:: Private residence Living Arrangements: Spouse/significant other;Children Available Help at Discharge: Family;Available 24 hours/day Type of Home: House Home Access: Stairs to enter Entrance Stairs-Rails: Left Entrance Stairs-Number of Steps: 2 Home Layout: Two  level;Able to live on main level with bedroom/bathroom Home Equipment: Adaptive equipment;Cane - single point;Walker - 2 wheels (reports he has access to shower chair)      Prior Function Level of Independence: Needs assistance      ADL's / Homemaking Assistance Needed: assist with  socks        Hand Dominance   Dominant Hand: Right    Extremity/Trunk Assessment   Upper Extremity Assessment: Defer to OT evaluation           Lower Extremity Assessment: Generalized weakness      Cervical / Trunk Assessment: Kyphotic  Communication   Communication: No difficulties  Cognition Arousal/Alertness: Lethargic Behavior During Therapy: WFL for tasks assessed/performed Overall Cognitive Status: No family/caregiver present to determine baseline cognitive functioning (difficulty following commands; slow processing;)       Memory: Decreased recall of precautions;Decreased short-term memory              General Comments General comments (skin integrity, edema, etc.): Reviewed don and doff brace.  Assisted pt with log roll as well.      Exercises        Assessment/Plan    PT Assessment Patient needs continued PT services  PT Diagnosis Generalized weakness;Acute pain   PT Problem List Decreased balance;Decreased mobility;Decreased activity tolerance;Decreased knowledge of use of DME;Decreased safety awareness;Decreased knowledge of precautions;Pain  PT Treatment Interventions DME instruction;Gait training;Functional mobility training;Therapeutic activities;Therapeutic exercise;Balance training;Patient/family education   PT Goals (Current goals can be found in the Care Plan section) Acute Rehab PT Goals Patient Stated Goal: not stated PT Goal Formulation: With patient Time For Goal Achievement: 01/13/15 Potential to Achieve Goals: Good    Frequency Min 5X/week   Barriers to discharge        Co-evaluation               End of Session Equipment Utilized During Treatment: Gait belt;Back brace Activity Tolerance: Patient limited by fatigue Patient left: in bed;with call bell/phone within reach Nurse Communication: Mobility status         Time: [id number] PT Time Calculation (min) (ACUTE ONLY): 23 min   Charges:   PT  Evaluation $Initial PT Evaluation Tier I: 1 Procedure PT Treatments $Gait Training: 8-22 mins   PT G CodesDenice Paradise 02-05-2015, 1:36 PM [place] Christine Schiefelbein,PT Acute Rehabilitation [phone] (pager)

## 2015-01-07 LAB — GLUCOSE, CAPILLARY
GLUCOSE-CAPILLARY: 115 mg/dL — AB (ref 70–99)
Glucose-Capillary: 113 mg/dL — ABNORMAL HIGH (ref 70–99)
Glucose-Capillary: 112 mg/dL — ABNORMAL HIGH (ref 70–99)
Glucose-Capillary: 115 mg/dL — ABNORMAL HIGH (ref 70–99)

## 2015-01-07 NOTE — Progress Notes (Signed)
Physical Therapy Treatment Patient Details Name: Roger Nelson MRN: [id number] DOB: 03-15-1941 Today's Date: 01/07/2015    History of Present Illness 74 y.o. s/p POSTERIOR LUMBAR FUSION 1 LEVEL L5-S1.    PT Comments    Pt admitted with above diagnosis. Pt currently with functional limitations due to balance and endurance deficits as well as decr cognition limiting progression.  Will need NHP as wife feels she cannot provide care for pt at home that he will need.  MD aware.  Continue PT as pt able.   Pt will benefit from skilled PT to increase their independence and safety with mobility to allow discharge to the venue listed below.    Follow Up Recommendations  SNF;Supervision/Assistance - 24 hour     Equipment Recommendations  None recommended by PT    Recommendations for Other Services       Precautions / Restrictions Precautions Precautions: Back;Fall Precaution Comments: educated wife on back precautions Required Braces or Orthoses: Spinal Brace Spinal Brace: Lumbar corset;Applied in sitting position Restrictions Weight Bearing Restrictions: No    Mobility  Bed Mobility               General bed mobility comments: Pt on 3N1 in bathroom on arrival with OT and wife present.  Pt ready to get up.  PT took over.    Transfers Overall transfer level: Needs assistance Equipment used: Rolling walker (2 wheeled) Transfers: Sit to/from Stand Sit to Stand: Mod assist         General transfer comment: no carry over from previous sessions.  Pt could not figure out where to put hands on 3N1 to stand.  Once cued verbally and tactilly, pt able to stand with mod assist.  COnstant cuing to stand tall as pt flexes constantly therefore needs cuing each second.  Pt needed total assist to clean bottom.  Pt ambulated from bathroom to recliner chair which was about 20 feet with mod assist due to max constant cuing needing to stay close to RW and stand up.  Also needed mod assist for  postural stability due to posterior lean and max cues and assist to maneuver RW.  Pt appears to not be able to process cues to stay close to RW and to stand upright at same time.  Very difficult to get pt back to recliner safely as he begins to sit too soon as well.  Overall poor safety awareness.    Ambulation/Gait Ambulation/Gait assistance: Mod assist;+2 physical assistance Ambulation Distance (Feet): 20 Feet Assistive device: Rolling walker (2 wheeled) Gait Pattern/deviations: Step-through pattern;Decreased stride length;Wide base of support;Trunk flexed;Drifts right/left   Gait velocity interpretation: Below normal speed for age/gender General Gait Details: On arrival, pt lethargic on 3N1.  Pt not processing commands well.  Pt having difficulty staying on task as well.  Needed constant cues to maintain postural stability and for all tasks ultimately.  Wife worried about pt and being able to care for him at home.     Stairs            Wheelchair Mobility    Modified Rankin (Stroke Patients Only)       Balance Overall balance assessment: Needs assistance;History of Falls       Postural control: Posterior lean Standing balance support: Bilateral upper extremity supported;During functional activity Standing balance-Leahy Scale: Poor Standing balance comment: Cannot maintain upright posture with RW.  Max tactile and verbal cues for postural correction.  Cognition Arousal/Alertness: Lethargic Behavior During Therapy: WFL for tasks assessed/performed Overall Cognitive Status: Impaired/Different from baseline Area of Impairment: Orientation;Attention;Memory;Safety/judgement;Awareness;Problem solving Orientation Level: Disoriented to;Time Current Attention Level: Selective Memory: Decreased recall of precautions;Decreased short-term memory (unable to recall any precautions from earlier session)   Safety/Judgement: Decreased awareness of  safety;Decreased awareness of deficits (unaware of falling posteriorly EOB) Awareness: Intellectual Problem Solving: Slow processing;Difficulty sequencing;Requires tactile cues;Requires verbal cues General Comments: Wife states pt is not at baseline cognitive status    Exercises      General Comments General comments (skin integrity, edema, etc.): Reviewed back precautions although pt not retaining information.  Very confused.  Spoke with wife about going home and wife overwhelmed and unsure she can care for pt at home.        Pertinent Vitals/Pain Pain Assessment: Faces Faces Pain Scale: Hurts even more Pain Location: back Pain Descriptors / Indicators: Grimacing Pain Intervention(s): Limited activity within patient's tolerance;Monitored during session;Premedicated before session;Repositioned  VSS    Home Living                      Prior Function            PT Goals (current goals can now be found in the care plan section) Progress towards PT goals: Not progressing toward goals - comment (pt lethargic with decr following commands.  )    Frequency  Min 5X/week    PT Plan Discharge plan needs to be updated    Co-evaluation             End of Session Equipment Utilized During Treatment: Gait belt;Back brace Activity Tolerance: Patient limited by fatigue;Patient limited by pain Patient left: in chair;with call bell/phone within reach;with family/visitor present     Time: [id number] PT Time Calculation (min) (ACUTE ONLY): 23 min  Charges:  $Gait Training: 8-22 mins $Self Care/Home Management: 8-22                    G CodesDenice Paradise 01/09/15, 2:26 PM Lenvil Swaim,PT Acute Rehabilitation [phone] (pager)

## 2015-01-07 NOTE — Progress Notes (Signed)
Patient ID: Roger Nelson, male   DOB: 06/16/1941, 74 y.o.   MRN: [id number]  Afeb, vss No new neuro issues Slowly increasing activity. He is definitely not ready for d/c today. He may benefoit for a SNF, and will get CSW to se regarding that.  His wound is clean and dry. Drain still in.Will remove drain today.

## 2015-01-07 NOTE — Clinical Social Work Note (Signed)
CSW notes consult for SNF at this time. CSW will complete FL2 and place on chart in the morning. CSW should be able to get patient a SNF bed by tomorrow. If patient is ready for DC tomorrow, please write DC order and Summary. Also please ensure any narcotics are signed by MD for patient so that facility can get patient's needed medications.   Liz Beach MSW, [place], [place], [phone]

## 2015-01-07 NOTE — Progress Notes (Addendum)
Occupational Therapy Treatment Patient Details Name: Roger Nelson MRN: [id number] DOB: [birth year] Today's Date: 01/07/2015    History of present illness 74 y.o. s/p POSTERIOR LUMBAR FUSION 1 LEVEL L5-S1.   OT comments  Pt making slow progress. High risk for falls. No carry over of information from yesterday's session due to apparent confusion. Wife states pt is not at his cognitive baseline. Requires at least mod A for functional mobility @ RW level with very short distance due to apparent difficulty with problem solving, slow processing and weakness/pain. Do not feel pt is safe to D/C home with wife at this level. Recommend short term SNF for rehab to facilitate safe D/C home. Pt/wife in agreement. Will follow acutely.   Follow Up Recommendations  SNF;Supervision/Assistance - 24 hour    Equipment Recommendations  3 in 1 bedside comode;Tub/shower bench    Recommendations for Other Services      Precautions / Restrictions Precautions Precautions: Back;Fall Precaution Comments: educated wife on back precautions Required Braces or Orthoses: Spinal Brace Spinal Brace: Lumbar corset;Applied in sitting position       Mobility Bed Mobility Overal bed mobility: Needs Assistance Bed Mobility: Rolling;Sidelying to Sit Rolling: Mod assist Sidelying to sit: Mod assist          Transfers Overall transfer level: Needs assistance Equipment used: Rolling walker (2 wheeled) Transfers: Stand Pivot Transfers Sit to Stand: Min assist Stand pivot transfers: Mod assist       General transfer comment: no carry over from previous sessions    Balance Overall balance assessment: Needs assistance           Standing balance-Leahy Scale: Poor Standing balance comment: forward head. could not maintain upright posutre in RW. Max vc for posutral correction                    ADL Overall ADL's : Needs assistance/impaired         Upper Body Bathing: Moderate assistance Upper  Body Bathing Details (indicate cue type and reason): unable to maintain upright postural control EOB to bath Lower Body Bathing: Moderate assistance Lower Body Bathing Details (indicate cue type and reason): mod vc to follow precautions     Lower Body Dressing: Moderate assistance   Toilet Transfer: Moderate assistance;Ambulation;BSC (over toilet) Toilet Transfer Details (indicate cue type and reason): pt demonstrated difficulty with ambulation from bed to toilet. required at least mod A to turn pt and physically sit pt on the [facility] Toileting- Clothing Manipulation and Hygiene: Maximal assistance       Functional mobility during ADLs: Moderate assistance;Rolling walker;Cueing for sequencing;Cueing for safety (continued cues for proper positioning inside RW. ) General ADL Comments: Pt does well with sit - stand and ambulate short distance @ 5 ft in straight line. When pt needs to make turns or move in small spaces, pt requires mas A to safely manange and prevent fall. Pt unable ot problem solve how to safely manage situation.      Vision                 Additional Comments: prosthetic eye-R   Perception     Praxis      Cognition   Behavior During Therapy: WFL for tasks assessed/performed Overall Cognitive Status: Impaired/Different from baseline Area of Impairment: Orientation;Attention;Memory;Safety/judgement;Awareness;Problem solving Orientation Level: Disoriented to;Time Current Attention Level: Selective Memory: Decreased recall of precautions;Decreased short-term memory (unable to recall any precautions from earlier session)    Safety/Judgement: Decreased awareness of safety;Decreased awareness  of deficits (unaware of falling posteriorly EOB) Awareness: Intellectual Problem Solving: Slow processing;Difficulty sequencing;Requires tactile cues;Requires verbal cues General Comments: Wife states pt is not at baseline cognitive status    Extremity/Trunk Assessment                Exercises     Shoulder Instructions       General Comments      Pertinent Vitals/ Pain       Pain Assessment: Faces Faces Pain Scale: Hurts even more Pain Location: back Pain Descriptors / Indicators: Grimacing Pain Intervention(s): Limited activity within patient's tolerance;Monitored during session  Home Living                                          Prior Functioning/Environment              Frequency Min 2X/week     Progress Toward Goals  OT Goals(current goals can now be found in the care plan section)  Progress towards OT goals: Progressing toward goals  Acute Rehab OT Goals Patient Stated Goal: to not fall OT Goal Formulation: With patient/family Time For Goal Achievement: 01/13/15 Potential to Achieve Goals: Good ADL Goals Pt Will Perform Lower Body Dressing: with set-up;with supervision;with adaptive equipment;sit to/from stand Pt Will Transfer to Toilet: ambulating;with modified independence Pt Will Perform Toileting - Clothing Manipulation and hygiene: with modified independence;sit to/from stand Pt Will Perform Tub/Shower Transfer: Shower transfer;with supervision;ambulating;rolling walker;shower seat Additional ADL Goal #1: Pt will independently verbalize 3/3 back precautions and maintain during ADLs/functional activities. Additional ADL Goal #2: Pt will don/doff back brace with setup A.  Plan Discharge plan needs to be updated    Co-evaluation                 End of Session Equipment Utilized During Treatment: Gait belt;Rolling walker   Activity Tolerance Patient limited by fatigue;Patient limited by pain   Patient Left Other (comment) (on toilet. left with PT)   Nurse Communication Mobility status;Other (comment) (D/C concerns)        Time: [id number] OT Time Calculation (min): 27 min  Charges: OT General Charges $OT Visit: 1 Procedure OT Treatments $Self Care/Home Management : 23-37  mins  Scotti Motter,[place] 01/07/2015, 10:44 AM   Maurie Boettcher, OTR/L  [phone] 01/07/2015

## 2015-01-07 NOTE — Progress Notes (Signed)
Patient arrived around 2130 from 3 central had L5-S1 PLIF and is continuing to have difficulty ambulating. Patient expressed moderate pain upon arrival and was medicated. Vitals and blood sugar are WNL he is resting comfortably at this time. Patient was handed off PRN nurse Verdene Lennert who is helping [place] on 4N tonight around 2300.

## 2015-01-07 NOTE — Progress Notes (Signed)
Placed patient on BIPAP 18/14 per home settings via FFM, 21% Fio2.  Patient tolerating well at this time.

## 2015-01-07 NOTE — Progress Notes (Signed)
PT. HAS ORDERS TO TRANSFER TO [facility].  REPORT CALLED TO STEVE RN.  FAMILY PRESENT AT BEDSIDE AND ARE PLANNING ON GOING TO [facility] WITH PT.  V/S AT 2017 B/P=120/62, P=51, R=20, TEMP=98.5, SAT=95 ON RA.  PT. TX. VIA WHEELCHAIR NOW.  NO DISTRESS NOTED. [place] RN

## 2015-01-08 ENCOUNTER — Encounter (HOSPITAL_COMMUNITY): Payer: Self-pay | Admitting: *Deleted

## 2015-01-08 LAB — GLUCOSE, CAPILLARY
GLUCOSE-CAPILLARY: 92 mg/dL (ref 70–99)
GLUCOSE-CAPILLARY: 101 mg/dL — AB (ref 70–99)
GLUCOSE-CAPILLARY: 112 mg/dL — AB (ref 70–99)
Glucose-Capillary: 113 mg/dL — ABNORMAL HIGH (ref 70–99)

## 2015-01-08 MED ORDER — TAMSULOSIN HCL 0.4 MG PO CAPS
0.4000 mg | ORAL_CAPSULE | Freq: Every day | ORAL | Status: DC
  Administered 2015-01-08 – 2015-01-11 (×4): 0.4 mg via ORAL
  Filled 2015-01-08 (×4): qty 1

## 2015-01-08 NOTE — Progress Notes (Signed)
Physical Therapy Treatment Patient Details Name: Roger Nelson MRN: [id number] DOB: November 20, 1940 Today's Date: 01/08/2015    History of Present Illness 74 y.o. s/p POSTERIOR LUMBAR FUSION 1 LEVEL L5-S1.    PT Comments    Patient progressing very well this session and able to walk out in hallway with Min A. Patient has just gotten up and back to bed but agreeable to ambulation and to return back to bed. Patient agreed to sit up again at meals later today. Patient was alert and oriented today with increased awareness and safety. Continue to recommend SNF for ongoing Physical Therapy.     Follow Up Recommendations  SNF;Supervision/Assistance - 24 hour     Equipment Recommendations  None recommended by PT    Recommendations for Other Services       Precautions / Restrictions Precautions Precautions: Back;Fall Precaution Comments: Patient able to recall precautions Required Braces or Orthoses: Spinal Brace Spinal Brace: Lumbar corset;Applied in sitting position    Mobility  Bed Mobility Overal bed mobility: Needs Assistance   Rolling: Min assist Sidelying to sit: Min assist          Transfers Overall transfer level: Needs assistance Equipment used: Rolling walker (2 wheeled)   Sit to Stand: Min assist         General transfer comment: Min A to ensure balance with stand. Cues for safe hand placement  Ambulation/Gait Ambulation/Gait assistance: Min assist Ambulation Distance (Feet): 100 Feet Assistive device: Rolling walker (2 wheeled) Gait Pattern/deviations: Step-through pattern;Decreased stride length Gait velocity: decreased Gait velocity interpretation: Below normal speed for age/gender General Gait Details: Patient able to ambulate slowly but well. No LOB noted. Good safe use of RW. Chair to follow but did not require rest break   Stairs            Wheelchair Mobility    Modified Rankin (Stroke Patients Only)       Balance                                    Cognition Arousal/Alertness: Awake/alert Behavior During Therapy: WFL for tasks assessed/performed Overall Cognitive Status: Within Functional Limits for tasks assessed                      Exercises      General Comments        Pertinent Vitals/Pain Pain Score: 5  Pain Location: back  Pain Descriptors / Indicators: Grimacing;Sore Pain Intervention(s): Monitored during session    Home Living                      Prior Function            PT Goals (current goals can now be found in the care plan section) Progress towards PT goals: Progressing toward goals    Frequency  Min 5X/week    PT Plan Current plan remains appropriate    Co-evaluation             End of Session Equipment Utilized During Treatment: Back brace;Gait belt Activity Tolerance: Patient tolerated treatment well Patient left: in bed;with call bell/phone within reach     Time: 0903-0918 PT Time Calculation (min) (ACUTE ONLY): 15 min  Charges:  $Gait Training: 8-22 mins                    G Codes:  Jacqualyn Posey 01/08/2015, 1:37 PM  01/08/2015 Jacqualyn Posey PTA [phone] pager [phone] office

## 2015-01-08 NOTE — Clinical Social Work Placement (Addendum)
Clinical Social Work Department CLINICAL SOCIAL WORK PLACEMENT NOTE 01/08/2015  Patient:  Roger Nelson, Roger Nelson  Account Number:  [id number] Admit date:  01/05/2015  Clinical Social Worker:  Greta Doom, LCSWA  Date/time:  01/08/2015 08:53 AM  Clinical Social Work is seeking post-discharge placement for this patient at the following level of care:   SKILLED NURSING   (*CSW will update this form in Epic as items are completed)   01/08/2015  Patient/family provided with [facility] Department of Clinical Social Work's list of facilities offering this level of care within the geographic area requested by the patient (or if unable, by the patient's family).  01/08/2015  Patient/family informed of their freedom to choose among providers that offer the needed level of care, that participate in Medicare, Medicaid or managed care program needed by the patient, have an available bed and are willing to accept the patient.  01/08/2015  Patient/family informed of [facility]' ownership interest in [facility], as well as of the fact that they are under no obligation to receive care at this facility.  PASARR submitted to EDS on 01/08/2015 PASARR number received on 01/08/2015  FL2 transmitted to all facilities in geographic area requested by pt/family on  01/08/2015 FL2 transmitted to all facilities within larger geographic area on 01/08/2015  Patient informed that his/her managed care company has contracts with or will negotiate with  certain facilities, including the following:     Patient/family informed of bed offers received:  01/08/2015 Patient chooses bed at [facility] recommends and patient chooses bed at    Patient to be transferred to  on   Patient to be transferred to facility by  Patient and family notified of transfer on  Name of family member notified:    The following physician request were entered in Epic:   Additional Comments:  [place], MSW,  [place]

## 2015-01-08 NOTE — Clinical Social Work Psychosocial (Signed)
Clinical Social Work Department BRIEF PSYCHOSOCIAL ASSESSMENT 01/08/2015  Patient:  Roger Nelson,Roger Nelson     Account Number:  [id number]     Admit date:  01/05/2015  Clinical Social Worker:  BIBBS,DYSHEKA, LCSWA  Date/Time:  01/08/2015 08:46 AM  Referred by:  RN  Date Referred:  01/08/2015 Referred for  SNF Placement   Other Referral:   Interview type:  Patient Other interview type:    PSYCHOSOCIAL DATA Living Status:  WIFE Admitted from facility:   Level of care:   Primary support name:  Ruth Ann Primary support relationship to patient:  SPOUSE Degree of support available:   Strong Support    CURRENT CONCERNS Current Concerns  Post-Acute Placement   Other Concerns:    SOCIAL WORK ASSESSMENT / PLAN CSW met the pt at the bedside.  CSW introduced self and purpose of the visit. CSW discussed clinical recommendation for SNF rehab. CSW inquired about the geographical location in which the pt would like to receive rehab from. Pt expressed concern regarding not being able to be close to home. CSW explained the SNF rehab process to the pt. CSW and pt discussed insurance and its relation to SNF rehab. CSW answered all questions in which the pt inquired about. CSW provided pt with contact information for further questions. CSW will continue to follow this pt and assist with discharge as needed.   Assessment/plan status:  Psychosocial Support/Ongoing Assessment of Needs Other assessment/ plan:   Information/referral to community resources:    PATIENT'S/FAMILY'S RESPONSE TO CURRENT DIAGNOSE: Pt presented with a normal affect and mood. Pt acknowledged that he is still in some pain. Pt expressed gratitude for a successful surgery. It appears the pt is hopeful about his recovery and ability to get back to his normal routine.     PATIENT'S/FAMILY'S RESPONSE TO PLAN OF CARE: Pt was receptive to tranitioning to SNF for rehab.    Dysheka Bibbs, MSW, LCSWA [phone]    

## 2015-01-08 NOTE — Progress Notes (Signed)
Patient ID: Roger Nelson, male   DOB: 1941/07/11, 74 y.o.   MRN: [id number] Vital signs are stable. Patient still notes a fair amount of back pain Drain has been removed but dressing today he appears saturated with serosanguineous fluid. We will paint incision with Betadine and change dressing daily Observe wound before discharge to sniff.

## 2015-01-08 NOTE — Clinical Social Work Note (Signed)
CSW has left message with 4N CSW regarding patient's transfer and need for SNF. This CSW signing off.  Liz Beach MSW, [place], [place], [phone]

## 2015-01-09 LAB — GLUCOSE, CAPILLARY
GLUCOSE-CAPILLARY: 103 mg/dL — AB (ref 70–99)
GLUCOSE-CAPILLARY: 120 mg/dL — AB (ref 70–99)
GLUCOSE-CAPILLARY: 105 mg/dL — AB (ref 70–99)
Glucose-Capillary: 111 mg/dL — ABNORMAL HIGH (ref 70–99)

## 2015-01-09 NOTE — Progress Notes (Signed)
Physical Therapy Treatment Patient Details Name: Roger Nelson MRN: [id number] DOB: Dec 17, 1940 Today's Date: 01/09/2015    History of Present Illness 74 y.o. s/p POSTERIOR LUMBAR FUSION 1 LEVEL L5-S1.    PT Comments    Patient sitting in bedside chair on arrival, agreeable to therapy, pain 4/10 but worse with position changes up to 6/10 today.  Min guard assist for transfer and mobility with RW, requested to defer bed mobility training at this time.  Education for continuous activity during day vs static position to decrease pain.  Patient states he is feeling better daily now.  Patient making progress with therapy, remains appropriate for skilled PT services.  Follow Up Recommendations  SNF;Supervision/Assistance - 24 hour     Equipment Recommendations  None recommended by PT    Recommendations for Other Services       Precautions / Restrictions Precautions Precautions: Back;Fall Precaution Comments: Patient able to recall precautions Required Braces or Orthoses: Spinal Brace Spinal Brace: Lumbar corset;Applied in sitting position Restrictions Weight Bearing Restrictions: No    Mobility  Bed Mobility               General bed mobility comments: Patient asks to defer bed mobility training today  Transfers Overall transfer level: Needs assistance Equipment used: Rolling walker (2 wheeled) Transfers: Sit to/from Stand Sit to Stand: Min guard Stand pivot transfers: Min guard       General transfer comment: Min guard due to pain, good recall of correct technique  Ambulation/Gait Ambulation/Gait assistance: Supervision Ambulation Distance (Feet): 200 Feet (2 x 100 feet) Assistive device: Rolling walker (2 wheeled) Gait Pattern/deviations: Step-through pattern;Decreased stride length;Antalgic Gait velocity: decreased Gait velocity interpretation: Below normal speed for age/gender General Gait Details: Patient able to ambulate slowly but well. No LOB noted. Good  safe use of RW. Rest break after 100 feet.   Stairs            Wheelchair Mobility    Modified Rankin (Stroke Patients Only)       Balance Overall balance assessment: Needs assistance   Sitting balance-Leahy Scale: Fair       Standing balance-Leahy Scale: Poor                      Cognition Arousal/Alertness: Awake/alert Behavior During Therapy: WFL for tasks assessed/performed Overall Cognitive Status: Within Functional Limits for tasks assessed       Memory:  (unable to recall any precautions from earlier session)   Safety/Judgement:  (unaware of falling posteriorly EOB)          Exercises      General Comments        Pertinent Vitals/Pain Pain Assessment: 0-10 Pain Score: 5  Pain Location: low back Pain Descriptors / Indicators: Grimacing;Sore Pain Intervention(s): Monitored during session;Premedicated before session    Home Living                      Prior Function            PT Goals (current goals can now be found in the care plan section) Acute Rehab PT Goals Patient Stated Goal: to not fall PT Goal Formulation: With patient Time For Goal Achievement: 01/13/15 Potential to Achieve Goals: Good Progress towards PT goals: Progressing toward goals    Frequency  Min 5X/week    PT Plan Current plan remains appropriate    Co-evaluation  End of Session Equipment Utilized During Treatment: Back brace;Gait belt Activity Tolerance: Patient tolerated treatment well Patient left: in chair;with call bell/phone within reach;with chair alarm set     Time: 1115-1135 PT Time Calculation (min) (ACUTE ONLY): 20 min  Charges:  $Gait Training: 8-22 mins                    G Codes:      Geralynn Capri L 01/19/2015, 11:44 AM

## 2015-01-09 NOTE — Progress Notes (Signed)
Patient to self-administer CPAP.  Patient is familiar with equipment and procedure.  Humidifier refilled with sterile water.

## 2015-01-09 NOTE — Progress Notes (Signed)
No issues overnight. Pt reports pain is significantly improved today. He is now voiding normally.  EXAM:  BP 118/56 mmHg  Pulse 75  Temp(Src) 98.3 F (36.8 C) (Oral)  Resp 18  Ht 5\' 6"  (1.676 m)  Wt 100.5 kg (221 lb 9 oz)  BMI 35.78 kg/m2  SpO2 94%  Awake, alert, oriented  Speech fluent, appropriate  CN grossly intact  5/5 BUE/BLE Wound c/d/i, no drainage.  IMPRESSION:  74 y.o. male POD# 4 s/p lumbar fusion, doing well  PLAN: - Cont to mobilize - Will plan on transfer to SNF on Mon.

## 2015-01-10 LAB — GLUCOSE, CAPILLARY
GLUCOSE-CAPILLARY: 111 mg/dL — AB (ref 70–99)
Glucose-Capillary: 111 mg/dL — ABNORMAL HIGH (ref 70–99)
Glucose-Capillary: 142 mg/dL — ABNORMAL HIGH (ref 70–99)

## 2015-01-10 NOTE — Progress Notes (Signed)
Rt Note:  Pt places self on/off cpap.

## 2015-01-10 NOTE — Progress Notes (Signed)
Physical Therapy Treatment Patient Details Name: Roger Nelson MRN: [id number] DOB: 1941-06-02 Today's Date: 01/10/2015    History of Present Illness 74 y.o. s/p POSTERIOR LUMBAR FUSION 1 LEVEL L5-S1.    PT Comments    Pt much improved over weekend vs Friday session. Pt at supervision level for ambulation. Able to ambulate steps today with min guard. Spoke with pt and family regarding D/C disposition. With progress pt is safe to D/C home with 24/7 (A). Will plan to see in morning to review and pt is hopeful for D/C home tomorrow.   Follow Up Recommendations  No PT follow up;Supervision/Assistance - 24 hour     Equipment Recommendations  None recommended by PT    Recommendations for Other Services       Precautions / Restrictions Precautions Precautions: Back Precaution Comments: pt able to recall 3/3 independently Required Braces or Orthoses: Spinal Brace Spinal Brace: Lumbar corset;Applied in sitting position Restrictions Weight Bearing Restrictions: No    Mobility  Bed Mobility Overal bed mobility: Modified Independent Bed Mobility: Rolling;Sidelying to Sit;Sit to Sidelying Rolling: Modified independent (Device/Increase time) Sidelying to sit: Modified independent (Device/Increase time)     Sit to sidelying: Modified independent (Device/Increase time) General bed mobility comments: pt utilizing handrails but able to recall and perform log rolling technique without cueing  Transfers Overall transfer level: Needs assistance Equipment used: Rolling walker (2 wheeled) Transfers: Sit to/from Stand Sit to Stand: Supervision         General transfer comment: supervision for safety and min cues for technique with RW  Ambulation/Gait Ambulation/Gait assistance: Supervision Ambulation Distance (Feet): 300 Feet (150' x 2) Assistive device: Rolling walker (2 wheeled) Gait Pattern/deviations: Step-through pattern;Decreased stride length Gait velocity: decreased Gait  velocity interpretation: Below normal speed for age/gender General Gait Details: pt using RW to guide and steady; no LOB noted with ambluation; required sitting rest break to increase mobility    Stairs Stairs: Yes   Stair Management: Step to pattern;Backwards;With walker;No rails Number of Stairs: 2 General stair comments: min guard to block RW; cues for sequencing and safety  Wheelchair Mobility    Modified Rankin (Stroke Patients Only)       Balance Overall balance assessment: Needs assistance Sitting-balance support: Feet supported;No upper extremity supported Sitting balance-Leahy Scale: Good     Standing balance support: During functional activity;Bilateral upper extremity supported Standing balance-Leahy Scale: Poor Standing balance comment: RW                     Cognition Arousal/Alertness: Awake/alert Behavior During Therapy: WFL for tasks assessed/performed Overall Cognitive Status: Within Functional Limits for tasks assessed                      Exercises      General Comments        Pertinent Vitals/Pain Pain Assessment: 0-10 Pain Score: 5  Pain Location: Rt hip Pain Descriptors / Indicators: Constant;Aching Pain Intervention(s): Monitored during session;Premedicated before session;Repositioned    Home Living                      Prior Function            PT Goals (current goals can now be found in the care plan section) Acute Rehab PT Goals Patient Stated Goal: to go home instead of rehab PT Goal Formulation: With patient Time For Goal Achievement: 01/13/15 Potential to Achieve Goals: Good Progress towards PT goals: Progressing toward  goals    Frequency  Min 5X/week    PT Plan Discharge plan needs to be updated    Co-evaluation             End of Session Equipment Utilized During Treatment: Back brace Activity Tolerance: Patient tolerated treatment well Patient left: in bed;with call bell/phone within  reach;with family/visitor present     Time: [id number] PT Time Calculation (min) (ACUTE ONLY): 16 min  Charges:  $Gait Training: 8-22 mins                    G CodesGustavus Bryant PT [id number] 01/10/2015, 3:36 PM

## 2015-01-10 NOTE — Progress Notes (Signed)
Patient ID: Roger Nelson, male   DOB: 10/22/1940, 74 y.o.   MRN: [id number] Vital signs have been stable Dressing had not been removed since Friday On inspection dressing appears modestly saturated and damp Incision itself appears stable without redness but concern remains for wound drainage Have advised to pain incision with Betadine and change dressing.

## 2015-01-11 LAB — GLUCOSE, CAPILLARY
GLUCOSE-CAPILLARY: 128 mg/dL — AB (ref 70–99)
Glucose-Capillary: 112 mg/dL — ABNORMAL HIGH (ref 70–99)

## 2015-01-11 MED ORDER — HYDROCODONE-ACETAMINOPHEN 5-325 MG PO TABS: 1.0000 | ORAL_TABLET | ORAL | Status: DC | PRN

## 2015-01-11 NOTE — Progress Notes (Signed)
Discharge instructions reviewed with patient and family. RX given. All questions answered at this time. Family/patient waiting for daughter for transportation. Patient/family also wanted to wait for next dose of pain med prior to discharge.   Ave Filter, RN

## 2015-01-11 NOTE — Discharge Summary (Signed)
  Physician Discharge Summary  Patient ID: Roger Nelson MRN: [id number] DOB/AGE: 1941/04/07 74 y.o.  Admit date: 01/05/2015 Discharge date: 01/11/2015  Admission Diagnoses:  Discharge Diagnoses:  Active Problems:   Spondylolisthesis of lumbar region   Discharged Condition: good  Hospital Course: Surgery 6 days ago for 1 level plif. Did well. Slow to advance, but eventually made progress, and was able to increase activity. By pod 6, ambulating well. Wound healing well. Home with specific instructions given. Had discussed SNF, but patient felt he was doing well enough to go home.  Consults: None  Significant Diagnostic Studies: none  Treatments: surgery: L5 S1 plif  Discharge Exam: Blood pressure 151/74, pulse 64, temperature 98.2 F (36.8 C), temperature source Oral, resp. rate 18, height 5\' 6"  (1.676 m), weight 100.5 kg (221 lb 9 oz), SpO2 94 %. Incision/Wound:healing well; no new neuro issues  Disposition: Final discharge disposition not confirmed     Medication List    ASK your doctor about these medications        benazepril 40 MG tablet  Commonly known as:  LOTENSIN  Take 40 mg by mouth daily.     Calcium Carb-Cholecalciferol 600-200 MG-UNIT Tabs  Take 2 tablets by mouth daily.     carvedilol 12.5 MG tablet  Commonly known as:  COREG  Take 12.5 mg by mouth 2 (two) times daily with a meal.     FLUoxetine 20 MG tablet  Commonly known as:  PROZAC  Take 20 mg by mouth daily.     hydrochlorothiazide 25 MG tablet  Commonly known as:  HYDRODIURIL  Take 25 mg by mouth daily.     Magnesium 200 MG Tabs  Take 400 mg by mouth daily.     metFORMIN 500 MG 24 hr tablet  Commonly known as:  GLUCOPHAGE-XR  Take 500 mg by mouth daily.     pravastatin 40 MG tablet  Commonly known as:  PRAVACHOL  Take 40 mg by mouth daily.         At home rest most of the time. Get up 9 or 10 times each day and take a 15 or 20 minute walk. No riding in the car and to your first  postoperative appointment. If you have neck surgery you may shower from the chest down starting on the third postoperative day. If you had back surgery he may start showering on the third postoperative day with saran wrap wrapped around your incisional area 3 times. After the shower remove the saran wrap. Take pain medicine as needed and other medications as instructed. Call my office for an appointment.  SignedFaythe Ghee, MD 01/11/2015, 9:07 AM

## 2015-01-11 NOTE — Clinical Social Work Note (Signed)
CSW reviewed chart, per PT note the pt can transition home. CSW met the pt at the bedside to discuss the change in his discharge plan. The pt is agreeable to transition home with [facility] PT/OT. CSW informed case Freight forwarder. CSW will sign off.   [place], MSW, [place]

## 2015-01-11 NOTE — Progress Notes (Signed)
Physical Therapy Treatment Patient Details Name: Roger Nelson MRN: [id number] DOB: 25-Aug-1941 Today's Date: 01/11/2015    History of Present Illness 74 y.o. s/p POSTERIOR LUMBAR FUSION 1 LEVEL L5-S1.    PT Comments    Patient continues to make good progress. Eager to DC home. Able to practice steps again this AM. Patient safe to D/C from a mobility standpoint based on progression towards goals set on PT eval.    Follow Up Recommendations  No PT follow up;Supervision/Assistance - 24 hour     Equipment Recommendations  None recommended by PT    Recommendations for Other Services       Precautions / Restrictions Precautions Precautions: Back Precaution Comments: pt able to recall 3/3 independently Required Braces or Orthoses: Spinal Brace Spinal Brace: Lumbar corset;Applied in sitting position    Mobility  Bed Mobility             Sit to sidelying: Min assist General bed mobility comments: Min A with LEs back into bed this session due to increasing pain  Transfers Overall transfer level: Needs assistance Equipment used: Rolling walker (2 wheeled)   Sit to Stand: Supervision         General transfer comment: supervision for safety and min cues for technique with RW  Ambulation/Gait Ambulation/Gait assistance: Supervision Ambulation Distance (Feet): 300 Feet Assistive device: Rolling walker (2 wheeled) Gait Pattern/deviations: Step-through pattern   Gait velocity interpretation: at or above normal speed for age/gender General Gait Details: Patient with good safe use of RW.    Stairs Stairs: Yes Stairs assistance: Min assist Stair Management: Backwards;Step to pattern;With walker Number of Stairs: 2 General stair comments: min A to block RW; cues for sequencing and safety  Wheelchair Mobility    Modified Rankin (Stroke Patients Only)       Balance                                    Cognition Arousal/Alertness:  Awake/alert Behavior During Therapy: WFL for tasks assessed/performed Overall Cognitive Status: Within Functional Limits for tasks assessed                      Exercises      General Comments        Pertinent Vitals/Pain Pain Score: 4  Pain Location: back Pain Descriptors / Indicators: Sore Pain Intervention(s): Monitored during session    Home Living                      Prior Function            PT Goals (current goals can now be found in the care plan section) Progress towards PT goals: Progressing toward goals    Frequency  Min 5X/week    PT Plan Current plan remains appropriate    Co-evaluation             End of Session Equipment Utilized During Treatment: Back brace Activity Tolerance: Patient tolerated treatment well Patient left: in bed;with call bell/phone within reach;with family/visitor present     Time: [id number] PT Time Calculation (min) (ACUTE ONLY): 19 min  Charges:  $Gait Training: 8-22 mins                    G Codes:      Jacqualyn Posey 01/11/2015, 9:27 AM  01/11/2015 Jacqualyn Posey PTA [phone] pager  [phone] office

## 2015-03-11 ENCOUNTER — Other Ambulatory Visit (HOSPITAL_BASED_OUTPATIENT_CLINIC_OR_DEPARTMENT_OTHER): Payer: Self-pay | Admitting: Neurosurgery

## 2015-03-11 DIAGNOSIS — M4317 Spondylolisthesis, lumbosacral region: Secondary | ICD-10-CM

## 2015-03-16 ENCOUNTER — Ambulatory Visit (HOSPITAL_BASED_OUTPATIENT_CLINIC_OR_DEPARTMENT_OTHER)
Admission: RE | Admit: 2015-03-16 | Discharge: 2015-03-16 | Disposition: A | Discharge status: Home / Self Care | Payer: Medicare Other | Source: Ambulatory Visit | Attending: Neurosurgery | Admitting: Neurosurgery

## 2015-03-16 DIAGNOSIS — S3992XD Unspecified injury of lower back, subsequent encounter: Secondary | ICD-10-CM | POA: Insufficient documentation

## 2015-03-16 DIAGNOSIS — W19XXXD Unspecified fall, subsequent encounter: Secondary | ICD-10-CM | POA: Insufficient documentation

## 2015-03-16 DIAGNOSIS — R29890 Loss of height: Secondary | ICD-10-CM | POA: Insufficient documentation

## 2015-03-16 DIAGNOSIS — Z96643 Presence of artificial hip joint, bilateral: Secondary | ICD-10-CM | POA: Diagnosis not present

## 2015-03-16 DIAGNOSIS — M778 Other enthesopathies, not elsewhere classified: Secondary | ICD-10-CM | POA: Insufficient documentation

## 2015-03-16 DIAGNOSIS — M5126 Other intervertebral disc displacement, lumbar region: Secondary | ICD-10-CM | POA: Diagnosis not present

## 2015-03-16 DIAGNOSIS — M4317 Spondylolisthesis, lumbosacral region: Secondary | ICD-10-CM

## 2015-03-16 DIAGNOSIS — M47896 Other spondylosis, lumbar region: Secondary | ICD-10-CM | POA: Diagnosis not present

## 2015-05-19 ENCOUNTER — Other Ambulatory Visit: Payer: Self-pay | Admitting: Neurosurgery

## 2015-05-19 DIAGNOSIS — M5416 Radiculopathy, lumbar region: Secondary | ICD-10-CM

## 2015-05-25 ENCOUNTER — Other Ambulatory Visit: Payer: Self-pay | Admitting: Radiology

## 2015-05-25 ENCOUNTER — Ambulatory Visit
Admission: RE | Admit: 2015-05-25 | Discharge: 2015-05-25 | Disposition: A | Discharge status: Home / Self Care | Payer: Medicare Other | Source: Ambulatory Visit | Attending: Neurosurgery | Admitting: Neurosurgery

## 2015-05-25 VITALS — BP 157/59 | HR 73

## 2015-05-25 DIAGNOSIS — M5416 Radiculopathy, lumbar region: Secondary | ICD-10-CM

## 2015-05-25 DIAGNOSIS — M47817 Spondylosis without myelopathy or radiculopathy, lumbosacral region: Secondary | ICD-10-CM

## 2015-05-25 DIAGNOSIS — M4310 Spondylolisthesis, site unspecified: Secondary | ICD-10-CM

## 2015-05-25 DIAGNOSIS — M5136 Other intervertebral disc degeneration, lumbar region: Secondary | ICD-10-CM

## 2015-05-25 DIAGNOSIS — M4316 Spondylolisthesis, lumbar region: Secondary | ICD-10-CM

## 2015-05-25 MED ORDER — MEPERIDINE HCL 100 MG/ML IJ SOLN
75.0000 mg | Freq: Once | INTRAMUSCULAR | Status: AC
  Administered 2015-05-25: 75 mg via INTRAMUSCULAR

## 2015-05-25 MED ORDER — IOHEXOL 180 MG/ML  SOLN
15.0000 mL | Freq: Once | INTRAMUSCULAR | Status: DC | PRN
  Administered 2015-05-25: 15 mL via INTRATHECAL

## 2015-05-25 MED ORDER — ONDANSETRON HCL 4 MG/2ML IJ SOLN
4.0000 mg | Freq: Once | INTRAMUSCULAR | Status: AC
  Administered 2015-05-25: 4 mg via INTRAMUSCULAR

## 2015-05-25 MED ORDER — DIAZEPAM 5 MG PO TABS: 5.0000 mg | ORAL_TABLET | Freq: Once | ORAL | Status: DC

## 2015-05-25 NOTE — Discharge Instructions (Signed)
Myelogram Discharge Instructions  1. Go home and rest quietly for the next 24 hours.  It is important to lie flat for the next 24 hours.  Get up only to go to the restroom.  You may lie in the bed or on a couch on your back, your stomach, your left side or your right side.  You may have one pillow under your head.  You may have pillows between your knees while you are on your side or under your knees while you are on your back.  2. DO NOT drive today.  Recline the seat as far back as it will go, while still wearing your seat belt, on the way home.  3. You may get up to go to the bathroom as needed.  You may sit up for 10 minutes to eat.  You may resume your normal diet and medications unless otherwise indicated.  Drink lots of extra fluids today and tomorrow.  4. The incidence of headache, nausea, or vomiting is about 5% (one in 20 patients).  If you develop a headache, lie flat and drink plenty of fluids until the headache goes away.  Caffeinated beverages may be helpful.  If you develop severe nausea and vomiting or a headache that does not go away with flat bed rest, call [phone].  5. You may resume normal activities after your 24 hours of bed rest is over; however, do not exert yourself strongly or do any heavy lifting tomorrow. If when you get up you have a headache when standing, go back to bed and force fluids for another 24 hours.  6. Call your physician for a follow-up appointment.  The results of your myelogram will be sent directly to your physician by the following day.  7. If you have any questions or if complications develop after you arrive home, please call [phone].  Discharge instructions have been explained to the patient.  The patient, or the person responsible for the patient, fully understands these instructions.       May resume Fluoxetine on Aug. 10, 2016, after 1:00 pm.

## 2015-05-25 NOTE — Progress Notes (Signed)
Patient states he has been off Fluoxetine for at least the past two days.

## 2015-11-09 ENCOUNTER — Telehealth: Payer: Self-pay | Admitting: Vascular Surgery

## 2015-11-09 NOTE — Telephone Encounter (Signed)
Spoke with pt re appt, dpm  °

## 2015-11-09 NOTE — Telephone Encounter (Signed)
-----   Message from Denman George, RN sent at 11/09/2015  2:47 PM EST ----- Regarding: need consult appt. with [facility] Dr. Hal Neer requesting appt for new pt. Consult with Dr. Donnetta Hutching to determine if this pt. Is a candidate for ALIF.  Please remind pt. To bring copy of LS spine film to appt.

## 2015-11-23 ENCOUNTER — Encounter: Payer: Self-pay | Admitting: Vascular Surgery

## 2015-11-30 ENCOUNTER — Ambulatory Visit (INDEPENDENT_AMBULATORY_CARE_PROVIDER_SITE_OTHER): Payer: Medicare Other | Admitting: Vascular Surgery

## 2015-11-30 ENCOUNTER — Encounter: Payer: Self-pay | Admitting: Vascular Surgery

## 2015-11-30 VITALS — BP 143/74 | HR 56 | Temp 96.0°F | Resp 18 | Ht 66.5 in | Wt 231.0 lb

## 2015-11-30 DIAGNOSIS — M5136 Other intervertebral disc degeneration, lumbar region: Secondary | ICD-10-CM | POA: Diagnosis not present

## 2015-11-30 NOTE — Progress Notes (Signed)
Filed Vitals:   11/30/15 0923 11/30/15 0924  BP: 146/78 143/74  Pulse: 56   Temp: 96 F (35.6 C)   TempSrc: Oral   Resp: 18   Height: 5' 6.5" (1.689 m)   Weight: 231 lb (104.781 kg)   SpO2: 98%

## 2015-11-30 NOTE — Progress Notes (Signed)
Vascular and Vein Specialist of [facility]  Patient name: Roger Nelson MRN: [id number] DOB: 04-14-1941 Sex: male  REASON FOR CONSULT: Degenerative disc disease for consideration of anterior exposure  HPI: Roger Nelson is a 75 y.o. male, who is seen today for evaluation of possible anterior exposure surgery.. He has had prior fusion of L5-S1 posteriorly. Not completely clear fromDr Kritzer's office note from August if 4-5 or 5-1 is the issue. The patient continues to have a significant pain in his back and lower extremities. No history of cardiac disease and no history of peripheral vascular occlusive disease. He does have obstructive sleep apnea.  Past Medical History  Diagnosis Date  . PONV (postoperative nausea and vomiting)   . Hypertension   . Diabetes mellitus without complication (HCC)     borderline  . Anxiety     having panic attacks awhile ago  . GERD (gastroesophageal reflux disease)     takes OTC every now and then  . Headache     h/o migraines--last one was 6 mths  . Dysrhythmia     palpitations caused by HCTZ  . Sleep apnea     tested about 2 yrs ago. has bipap  . 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 Coronary artery disease     found out about it 6-7 yrs ago. [facility] in [employer]    Family History  Problem Relation Age of Onset  . Heart disease Father   . Cancer Father     SOCIAL HISTORY: Social History   Social History  . Marital Status: Married    Spouse Name: N/A  . Number of Children: N/A  . Years of Education: N/A   Occupational History  . Not on file.   Social History Main Topics  . Smoking status: Former Research scientist (life sciences)  . Smokeless tobacco: Not on file  . Alcohol Use: No  . Drug Use: No  . Sexual Activity: Not on file   Other Topics Concern  . Not on file   Social History Narrative    No Known Allergies  Current Outpatient Prescriptions  Medication Sig Dispense Refill  . Amlodipine Besylate POWD 10 mg  by Does not apply route.    . benazepril (LOTENSIN) 40 MG tablet Take 40 mg by mouth daily.    . Calcium Carb-Cholecalciferol 600-200 MG-UNIT TABS Take 2 tablets by mouth daily.    . carvedilol (COREG) 12.5 MG tablet Take 25 mg by mouth 2 (two) times daily with a meal.     . FLUoxetine (PROZAC) 20 MG tablet Take 20 mg by mouth daily.    . hydrochlorothiazide (HYDRODIURIL) 25 MG tablet Take 25 mg by mouth. Takes every third day    . Magnesium 200 MG TABS Take 400 mg by mouth daily.    . metFORMIN (GLUCOPHAGE-XR) 500 MG 24 hr tablet Take 500 mg by mouth daily.  0  . pravastatin (PRAVACHOL) 40 MG tablet Take 40 mg by mouth daily.  0  . HYDROcodone-acetaminophen (NORCO/VICODIN) 5-325 MG per tablet Take 1-2 tablets by mouth every 4 (four) hours as needed for moderate pain. (Patient not taking: Reported on 11/30/2015) 65 tablet 0  . oxyCODONE-acetaminophen (PERCOCET/ROXICET) 5-325 MG per tablet Take 1 tablet by mouth every 6 (six) hours as needed for severe pain (q4-6 as needed for pain). Reported on 11/30/2015     No current facility-administered medications for this visit.    REVIEW OF SYSTEMS:  [X]  denotes  positive finding, [ ]  denotes negative finding Cardiac  Comments:  Chest pain or chest pressure:    Shortness of breath upon exertion:    Short of breath when lying flat:    Irregular heart rhythm:        Vascular    Pain in calf, thigh, or hip brought on by ambulation: x  neurogenic claudication   Pain in feet at night that wakes you up from your sleep:     Blood clot in your veins:    Leg swelling:         Pulmonary    Oxygen at home:    Productive cough:     Wheezing:         Neurologic    Sudden weakness in arms or legs:     Sudden numbness in arms or legs:     Sudden onset of difficulty speaking or slurred speech:    Temporary loss of vision in one eye:     Problems with dizziness:         Gastrointestinal    Blood in stool:     Vomited blood:         Genitourinary      Burning when urinating:     Blood in urine:        Psychiatric    Major depression:         Hematologic    Bleeding problems:    Problems with blood clotting too easily:        Skin    Rashes or ulcers: x       Constitutional    Fever or chills:      PHYSICAL EXAM: Filed Vitals:   11/30/15 0923 11/30/15 0924  BP: 146/78 143/74  Pulse: 56   Temp: 96 F (35.6 C)   TempSrc: Oral   Resp: 18   Height: 5' 6.5" (1.689 m)   Weight: 231 lb (104.781 kg)   SpO2: 98%     GENERAL: The patient is a well-nourished male, in no acute distress. The vital signs are documented above. CARDIAC: There is a regular rate and rhythm.  VASCULAR: 2+ radial and 2+ dorsalis pedis pulses bilaterally PULMONARY: There is good air exchange bilaterally without wheezing or rales. ABDOMEN: Soft and non-tender with normal pitched bowel sounds. Obese. Large umbilical hernia which is reducible. MUSCULOSKELETAL: There are no major deformities or cyanosis. NEUROLOGIC: No focal weakness or paresthesias are detected. SKIN: There are no ulcers or rashes noted. PSYCHIATRIC: The patient has a normal affect.  DATA:  I reviewed his CT lumbar films from August 2006. This does reveal calcification in his aorta and iliacs. This is less so in the iliac vessels more so at the 45 level.  MEDICAL ISSUES: Had long discussion with the patient and his wife present. He has had prior abdominal surgery but this was laparoscopic cholecystectomy and laparoscopic umbilical hernia repair which should not affect anterior approach. He is obese but that do not feel that he is outside the level where he could have safe anterior exposure. I do feel he would be a candidate for L5-S1 anterior approach. There is a less calcification and his disc is easily approached between the bifurcation of the iliac vessels. Would be concerned about a 45 approach. Does have significant calcification of his aorta at this level. I called Dr Sande Rives office  today. He is not available currently. Left this message with his clinical staff. Would be available for anterior approach for L5-S1  if indicated.   Curt Jews [facility] Specialists of [place]: [phone]

## 2016-10-16 HISTORY — PX: SPINAL FUSION: SHX223

## 2018-08-26 ENCOUNTER — Telehealth: Payer: Self-pay | Admitting: Neurology

## 2018-08-26 ENCOUNTER — Encounter: Payer: Self-pay | Admitting: Neurology

## 2018-08-26 ENCOUNTER — Ambulatory Visit (INDEPENDENT_AMBULATORY_CARE_PROVIDER_SITE_OTHER): Payer: Medicare Other | Admitting: Neurology

## 2018-08-26 VITALS — BP 163/85 | HR 55 | Ht 66.5 in | Wt 231.5 lb

## 2018-08-26 DIAGNOSIS — R202 Paresthesia of skin: Secondary | ICD-10-CM | POA: Insufficient documentation

## 2018-08-26 DIAGNOSIS — R269 Unspecified abnormalities of gait and mobility: Secondary | ICD-10-CM | POA: Diagnosis not present

## 2018-08-26 DIAGNOSIS — G3281 Cerebellar ataxia in diseases classified elsewhere: Secondary | ICD-10-CM

## 2018-08-26 NOTE — Progress Notes (Signed)
PATIENT: Roger Nelson DOB: 06-16-1941  Chief Complaint  Patient presents with  . Extremity Weakness    He is here with his wife, [name].  Reports lower leg weakness, worsening over the last three years.  His symptoms cause him gait difficulty and has resulted in multiple falls.  He uses a rolling walker to assist with ambulation.  He is currently in PT twice weekly.  He has history of two previous back surgery.  Marland Kitchen PCP    Iva Lento, PA-C     HISTORICAL  Roger Nelson is a 77 year old male, seen in request by his primary care PA Iva Lento for evaluation of extremity weakness gait abnormality, He is accompanied by his wife views at today's clinical visit, initial evaluation was on August 26, 2018.  I have reviewed and summarized the referring note from the referring physician.  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  He had a multiple lumbar decompression surgery in the past for his low back pain, initial surgery was in 2016, and second surgery was in 2017, but he reported only partial improvement, he had continued to have midline low back pain, denies radiating pain to left lower extremity.  He also complains of chronic neck pain, when he turned his neck suddenly, he hears the cracking sounds.  He has urinary urgency, but no incontinence.  He was noted to have gradual onset gait abnormality since 2016 initially attributed to his low back issues, has relied on his walker since 2017.  Now he has gradual worsening memory loss, can only take a few steps without the walker, tendency to fall, he also complains of bilateral feet paresthesia, intermittent left hand paresthesia, history of right hand injury in the past, mild right hand grip weakness at  baseline.  Bilateral lower extremity arterial Doppler study on June 12, 2018, no hemodynamic significant stenosis in the right lower extremity,  Bilateral carotid Doppler study on June 11, 2018, no hemodynamic significant stenosis of bilateral internal carotid artery  Laboratory evaluation 2019, normal CMP, with exception of mild elevated glucose 121, hemoglobin of 13.9,  Echocardiogram and TEE on July 16, 2018 at [facility]: Left ventricle is mildly dilated, there is moderate concentric left ventricular hypertrophy, ejection fraction of 40%, no thrombus is detected in the left atrium,  REVIEW OF SYSTEMS: Full 14 system review of systems performed and notable only for palpitation, hearing loss, rash, moles, easy bruising, feeling hot, cold, flushing, runny nose, numbness, weakness, dizziness, passing out, depression, anxiety, decreased energy, disinterested in activities. All other review of systems were negative.  ALLERGIES: No Known Allergies  HOME MEDICATIONS: Current Outpatient Medications  Medication Sig Dispense Refill  . alendronate (FOSAMAX) 70 MG tablet Take 70 mg by mouth once a week.     Marland Kitchen amLODipine (NORVASC) 5 MG tablet Take 5 mg by mouth daily.     Marland Kitchen apixaban (ELIQUIS) 5 MG TABS tablet Take 5 mg by mouth 2 (two) times daily.     . benazepril (LOTENSIN) 40 MG tablet Take 40 mg by mouth daily.    . Calcium Carb-Cholecalciferol 600-200 MG-UNIT TABS Take 2 tablets by mouth daily.    Marland Kitchen FLUoxetine (PROZAC) 20 MG tablet Take 20 mg by mouth daily.    . furosemide (LASIX) 20 MG tablet Take 20 mg by  mouth daily.     . Magnesium 200 MG TABS Take 400 mg by mouth daily.    . metFORMIN (GLUCOPHAGE-XR) 500 MG 24 hr tablet Take 500 mg by mouth daily.  0  . metoprolol succinate (TOPROL-XL) 25 MG 24 hr tablet Take 25 mg by mouth 2 (two) times daily.     Marland Kitchen oxyCODONE (OXY IR/ROXICODONE) 5 MG immediate release tablet TAKE 1 TABLET BY MOUTH 4 TIMES DAILY AS NEEDED  0  . pravastatin  (PRAVACHOL) 40 MG tablet Take 40 mg by mouth daily.  0   No current facility-administered medications for this visit.     PAST MEDICAL HISTORY: Past Medical History:  Diagnosis Date  . A-fib ([place])   . Anxiety    having panic attacks awhile ago  . 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 Chronic back pain   . Coronary artery disease    found out about it 6-7 yrs ago. 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 Depression   . Diabetes mellitus without complication (HCC)    borderline  . Dysrhythmia    palpitations caused by HCTZ  . GERD (gastroesophageal reflux disease)    takes OTC every now and then  . Headache    h/o migraines--last one was 6 mths  . Heart disease   . Hypertension   . Leg weakness   . PONV (postoperative nausea and vomiting)   . Sleep apnea    tested about 2 yrs ago. has bipap    PAST SURGICAL HISTORY: Past Surgical History:  Procedure Laterality Date  . APPENDECTOMY    . CHOLECYSTECTOMY    . colon was twisted     medically treated  . EYE SURGERY     right eye removed d/t cancer  . FRACTURE SURGERY     right arm  . HERNIA REPAIR     umbilical  . JOINT REPLACEMENT     bilateral hips  . SHOULDER SURGERY    . SPINAL FUSION  12-2014   Dr. Hal Neer  . SPINAL FUSION  2018   Dr. Owens Shark  . TONSILLECTOMY      FAMILY HISTORY: Family History  Problem Relation Age of Onset  . Heart disease Father   . Lung cancer Father   . Hypertension Mother     SOCIAL HISTORY: Social History   Socioeconomic History  . Marital status: Married    Spouse name: Not on file  . Number of children: 3  . Years of education: Art School  . Highest education level: Not on file  Occupational History  . Occupation: Retired  Scientific laboratory technician  . Financial resource strain: Not on file  . Food insecurity:    Worry: Not on file    Inability: Not on file  . Transportation needs:    Medical: Not on file    Non-medical: Not on file  Tobacco Use  . Smoking  status: Former Smoker    Types: Cigarettes  . Smokeless tobacco: Never Used  . Tobacco comment: quit at age 18  Substance and Sexual Activity  . Alcohol use: No  . Drug use: No  . Sexual activity: Not on file  Lifestyle  . Physical activity:    Days per week: Not on file    Minutes per session: Not on file  . Stress: Not on file  Relationships  . Social connections:    Talks on phone: Not on file    Gets together: Not on  file    Attends religious service: Not on file    Active member of club or organization: Not on file    Attends meetings of clubs or organizations: Not on file    Relationship status: Not on file  . Intimate partner violence:    Fear of current or ex partner: Not on file    Emotionally abused: Not on file    Physically abused: Not on file    Forced sexual activity: Not on file  Other Topics Concern  . Not on file  Social History Narrative   Lives at home with his wife.   Right-handed.   Caffeine use:  2 cups coffee, 3 cups tea daily.     PHYSICAL EXAM   Vitals:   08/26/18 1400  BP: (!) 163/85  Pulse: (!) 55  Weight: 231 lb 8 oz (105 kg)  Height: 5' 6.5" (1.689 m)    Not recorded      Body mass index is 36.81 kg/m.  PHYSICAL EXAMNIATION:  Gen: NAD, conversant, well nourised, obese, well groomed                     Cardiovascular: Regular rate rhythm, no peripheral edema, warm, nontender. Eyes: Conjunctivae clear without exudates or hemorrhage Neck: Supple, no carotid bruits. Pulmonary: Clear to auscultation bilaterally   NEUROLOGICAL EXAM:  MENTAL STATUS: Speech:    Speech is normal; fluent and spontaneous with normal comprehension.  Cognition:     Orientation to time, place and person     Normal recent and remote memory     Normal Attention span and concentration     Normal Language, naming, repeating,spontaneous speech     Fund of knowledge   CRANIAL NERVES: CN II: Visual fields are full to confrontation. Pupils are round equal  and briskly reactive to light. CN III, IV, VI: extraocular movement are normal. No ptosis. CN V: Facial sensation is intact to pinprick in all 3 divisions bilaterally. Corneal responses are intact.  CN VII: Face is symmetric with normal eye closure and smile. CN VIII: Hearing is normal to rubbing fingers CN IX, X: Palate elevates symmetrically. Phonation is normal. CN XI: Head turning and shoulder shrug are intact CN XII: Tongue is midline with normal movements and no atrophy.  MOTOR: Mild right hand intrinsic muscle atrophy, grip weakness is at baseline per patient from previous right hand injury, there is no significant left arm or bilateral lower extremity proximal distal muscle weakness.  REFLEXES: Reflexes are 2+ and symmetric at the biceps, 3/3 triceps, knees, and trace at ankles. Plantar responses are flexor.  SENSORY: Length dependent decreased to light touch, pinprick and vibratory sensation to distal leg.  COORDINATION: Rapid alternating movements and fine finger movements are intact. There is no dysmetria on finger-to-nose and heel-knee-shin.    GAIT/STANCE: Needs pushed on his walker to get up from seated position, wide-based, cautious unsteady gait.  DIAGNOSTIC DATA (LABS, IMAGING, TESTING) - I reviewed patient records, labs, notes, testing and imaging myself where available.   ASSESSMENT AND PLAN  LAITH ANTONELLI is a 77 y.o. male   Gait abnormality  Multifactorial, obesity, deconditioning, chronic low back pain, peripheral neuropathy,  Hyperreflexia at bilateral triceps, knee reflexes, need to rule out cervical spondylitic myelopathy  Proceed with MRI of cervical spine  EMG nerve conduction study  Continue physical therapy  Marcial Pacas, M.D. Ph.D.  [facility] [address] [place], [place] [address] Ph: [phone] Fax: [phone]  CC: Iva Lento, PA-C

## 2018-08-26 NOTE — Telephone Encounter (Signed)
medicare/aarp order sent to [facility] pt is aware. He knows he should hear something in the next 2-3 business days he also has their number.

## 2018-09-08 ENCOUNTER — Other Ambulatory Visit: Payer: Medicare Other

## 2018-09-11 ENCOUNTER — Ambulatory Visit
Admission: RE | Admit: 2018-09-11 | Discharge: 2018-09-11 | Disposition: A | Discharge status: Home / Self Care | Payer: Medicare Other | Source: Ambulatory Visit | Attending: Neurology | Admitting: Neurology

## 2018-09-11 DIAGNOSIS — G3281 Cerebellar ataxia in diseases classified elsewhere: Secondary | ICD-10-CM

## 2018-09-16 ENCOUNTER — Telehealth: Payer: Self-pay | Admitting: Neurology

## 2018-09-16 DIAGNOSIS — M4802 Spinal stenosis, cervical region: Secondary | ICD-10-CM

## 2018-09-16 DIAGNOSIS — R269 Unspecified abnormalities of gait and mobility: Secondary | ICD-10-CM

## 2018-09-16 NOTE — Telephone Encounter (Signed)
See separate note on same day for further information.

## 2018-09-16 NOTE — Telephone Encounter (Signed)
Received call from PT today:   Mary from PT called to let Dr. Krista Blue know the patient has fallen 3 times in the last 1.5 weeks. He always complains of foggy feelings before falling. She would like to know if he needs to come in for a follow up. Please call [facility] back.

## 2018-09-16 NOTE — Telephone Encounter (Signed)
Please call patient, MRI of cervical spine showed multilevel degenerative changes, mostly severe at C3-4, with evidence of severe spinal canal stenosis, cord signal change,  This will explain his gait abnormality, frequent falling, I will refer him to neurosurgeon for evaluation.  Keep follow-up appointment as previously scheduled    IMPRESSION: This MRI of the cervical spine shows multilevel degenerative changes as detailed above.  Most significant findings are: 1.    At C2-C3, there is mild spinal stenosis but no nerve root compression. 2.    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   There is subtle T2 hyperintense signal just below this point of maximum stenosis on the sagittal images, not confirmed on the axial images, that could be consistent with mild myelopathic changes.  There is possible left C4 nerve root compression. 3.    At C4-C5, there is moderate spinal stenosis and possible right C5 nerve root compression 4.    At C5-C6, there is mild spinal stenosis but no nerve root compression. 5.    Mild degenerative changes at C6-C7 do not lead to spinal stenosis or nerve root compression.

## 2018-09-16 NOTE — Telephone Encounter (Signed)
The patient and Stanton Kidney (physical therapist) are aware of the MRI results below. The patient is agreeable to the neurosurgery referral.  He will also keep his pending EMG/NCV appt on 09/27/18.  Stanton Kidney will continue PT with him to help him avoid further falls.

## 2018-09-16 NOTE — Telephone Encounter (Signed)
Mary from PT called to let Dr. Krista Blue know the patient has fallen 3 times in the last 1.5 weeks. He always complains of foggy feelings before falling. She would like to know if he needs to come in for a follow up. Please call [facility] back.

## 2018-09-18 NOTE — Telephone Encounter (Addendum)
Spoke to PT - they would also like to add removable leg rests to the requested order below. Per vo by Dr. Krista Blue, okay to provide prescription.  It has been signed, faxed and confirmed to the number below.

## 2018-09-18 NOTE — Addendum Note (Signed)
Addended by: Noberto Retort C on: 09/18/2018 12:04 PM   Modules accepted: Orders

## 2018-09-18 NOTE — Telephone Encounter (Signed)
Roger Nelson [phone]) called stating the pt fell again today, would like Dr. Krista Blue to fax over a rx for a wheelchair with cushion faxed to [phone].

## 2018-09-27 ENCOUNTER — Telehealth: Payer: Self-pay | Admitting: Neurology

## 2018-09-27 ENCOUNTER — Ambulatory Visit (INDEPENDENT_AMBULATORY_CARE_PROVIDER_SITE_OTHER): Payer: Medicare Other | Admitting: Neurology

## 2018-09-27 DIAGNOSIS — R269 Unspecified abnormalities of gait and mobility: Secondary | ICD-10-CM

## 2018-09-27 DIAGNOSIS — R202 Paresthesia of skin: Secondary | ICD-10-CM

## 2018-09-27 DIAGNOSIS — M5412 Radiculopathy, cervical region: Secondary | ICD-10-CM | POA: Diagnosis not present

## 2018-09-27 DIAGNOSIS — G3281 Cerebellar ataxia in diseases classified elsewhere: Secondary | ICD-10-CM

## 2018-09-27 NOTE — Telephone Encounter (Signed)
Please make sure that he is refer to neurosurgeon soon

## 2018-09-27 NOTE — Progress Notes (Signed)
PATIENT: Roger Nelson DOB: 1941-01-19  No chief complaint on file.    HISTORICAL  Roger Nelson is a 77 year old male, seen in request by his primary care PA Roger Nelson for evaluation of extremity weakness gait abnormality, He is accompanied by his wife views at today's clinical visit, initial evaluation was on August 26, 2018.  I have reviewed and summarized the referring note from the referring physician.  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  He had a multiple lumbar decompression surgery in the past for his low back pain, initial surgery was in 2016, and second surgery was in 2017, but he reported only partial improvement, he had continued to have midline low back pain, denies radiating pain to left lower extremity.  He also complains of chronic neck pain, when he turned his neck suddenly, he hears the cracking sounds.  He has urinary urgency, but no incontinence.  He was noted to have gradual onset gait abnormality since 2016 initially attributed to his low back issues, has relied on his walker since 2017.  Now he has gradual worsening gait abnormality, can only take a few steps without the walker, tendency to fall, he also complains of bilateral feet paresthesia, intermittent left hand paresthesia, history of right hand injury in the past, mild right hand grip weakness at baseline.  Bilateral lower extremity arterial Doppler study on June 12, 2018, no hemodynamic significant stenosis in the right lower extremity,  Bilateral carotid Doppler study on June 11, 2018, no hemodynamic significant stenosis of bilateral internal carotid artery  Laboratory evaluation 2019, normal CMP, with exception of mild elevated glucose 121, hemoglobin of  13.9,  Echocardiogram and TEE on July 16, 2018 at [facility]: Left ventricle is mildly dilated, there is moderate concentric left ventricular hypertrophy, ejection fraction of 40%, no thrombus is detected in the left atrium,  UPDATE Sep 27 2018: He return for electrodiagnostic study today, which showed evidence of active left cervical radiculopathy mainly involving left C5-6 myotomes.  We personally reviewed MRI of cervical spine November 2019, multilevel degenerative changes, most severe at C3-4, severe spinal stenosis with AP diameter of 4.9 mm, subtle T2 hyperintensity signal change, possible left C4 nerve root compression.  Moderate spinal stenosis at C4-5, variable degree of foraminal narrowing,  He does complains of neck pain, worsening gait abnormality, arm weakness, bladder incontinence.  REVIEW OF SYSTEMS: Full 14 system review of systems performed and notable only for as above All other review of systems were negative.  ALLERGIES: No Known Allergies  HOME MEDICATIONS: Current Outpatient Medications  Medication Sig Dispense Refill  . alendronate (FOSAMAX) 70 MG tablet Take 70 mg by mouth once a week.     Marland Kitchen amLODipine (NORVASC) 5 MG tablet Take 5 mg by mouth daily.     Marland Kitchen apixaban (ELIQUIS) 5 MG TABS tablet Take 5 mg by mouth 2 (two) times daily.     . benazepril (LOTENSIN) 40 MG tablet Take 40 mg by mouth daily.    . Calcium Carb-Cholecalciferol 600-200 MG-UNIT TABS Take 2 tablets by mouth daily.    Marland Kitchen FLUoxetine (PROZAC) 20 MG tablet Take 20 mg by mouth daily.    . furosemide (LASIX) 20 MG tablet Take 20 mg by mouth daily.     . Magnesium 200 MG TABS Take 400  mg by mouth daily.    . metFORMIN (GLUCOPHAGE-XR) 500 MG 24 hr tablet Take 500 mg by mouth daily.  0  . metoprolol succinate (TOPROL-XL) 25 MG 24 hr tablet Take 25 mg by mouth 2 (two) times daily.     Marland Kitchen oxyCODONE (OXY IR/ROXICODONE) 5 MG immediate release tablet TAKE 1 TABLET BY MOUTH 4 TIMES DAILY AS NEEDED  0  .  pravastatin (PRAVACHOL) 40 MG tablet Take 40 mg by mouth daily.  0   No current facility-administered medications for this visit.     PAST MEDICAL HISTORY: Past Medical History:  Diagnosis Date  . A-fib ([place])   . Anxiety    having panic attacks awhile ago  . Cancer ([place])    melanoma in his eye right--wears prosthetic  . Chronic back pain   . Coronary artery disease    found out about it 6-7 yrs ago. 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 Depression   . Diabetes mellitus without complication (HCC)    borderline  . Dysrhythmia    palpitations caused by HCTZ  . GERD (gastroesophageal reflux disease)    takes OTC every now and then  . Headache    h/o migraines--last one was 6 mths  . Heart disease   . Hypertension   . Leg weakness   . PONV (postoperative nausea and vomiting)   . Sleep apnea    tested about 2 yrs ago. has bipap    PAST SURGICAL HISTORY: Past Surgical History:  Procedure Laterality Date  . APPENDECTOMY    . CHOLECYSTECTOMY    . colon was twisted     medically treated  . EYE SURGERY     right eye removed d/t cancer  . FRACTURE SURGERY     right arm  . HERNIA REPAIR     umbilical  . JOINT REPLACEMENT     bilateral hips  . SHOULDER SURGERY    . SPINAL FUSION  12-2014   Dr. Hal Neer  . SPINAL FUSION  2018   Dr. Owens Shark  . TONSILLECTOMY      FAMILY HISTORY: Family History  Problem Relation Age of Onset  . Heart disease Father   . Lung cancer Father   . Hypertension Mother     SOCIAL HISTORY: Social History   Socioeconomic History  . Marital status: Married    Spouse name: Not on file  . Number of children: 3  . Years of education: Art School  . Highest education level: Not on file  Occupational History  . Occupation: Retired  Scientific laboratory technician  . Financial resource strain: Not on file  . Food insecurity:    Worry: Not on file    Inability: Not on file  . Transportation needs:    Medical: Not on file    Non-medical: Not on file  Tobacco Use  .  Smoking status: Former Smoker    Types: Cigarettes  . Smokeless tobacco: Never Used  . Tobacco comment: quit at age 33  Substance and Sexual Activity  . Alcohol use: No  . Drug use: No  . Sexual activity: Not on file  Lifestyle  . Physical activity:    Days per week: Not on file    Minutes per session: Not on file  . Stress: Not on file  Relationships  . Social connections:    Talks on phone: Not on file    Gets together: Not on file    Attends religious service: Not on file  Active member of club or organization: Not on file    Attends meetings of clubs or organizations: Not on file    Relationship status: Not on file  . Intimate partner violence:    Fear of current or ex partner: Not on file    Emotionally abused: Not on file    Physically abused: Not on file    Forced sexual activity: Not on file  Other Topics Concern  . Not on file  Social History Narrative   Lives at home with his wife.   Right-handed.   Caffeine use:  2 cups coffee, 3 cups tea daily.     PHYSICAL EXAM   There were no vitals filed for this visit.  Not recorded      There is no height or weight on file to calculate BMI.  PHYSICAL EXAMNIATION:  Gen: NAD, conversant, well nourised, obese, well groomed                     Cardiovascular: Regular rate rhythm, no peripheral edema, warm, nontender. Eyes: Conjunctivae clear without exudates or hemorrhage Neck: Supple, no carotid bruits. Pulmonary: Clear to auscultation bilaterally   NEUROLOGICAL EXAM:  MENTAL STATUS: Speech:    Speech is normal; fluent and spontaneous with normal comprehension.  Cognition:     Orientation to time, place and person     Normal recent and remote memory     Normal Attention span and concentration     Normal Language, naming, repeating,spontaneous speech     Fund of knowledge   CRANIAL NERVES: CN II: Visual fields are full to confrontation.  Right eye prosthetic, left pupil round reactive to light CN III,  IV, VI: extraocular movement are normal. No ptosis. CN V: Facial sensation is intact to pinprick in all 3 divisions bilaterally. Corneal responses are intact.  CN VII: Face is symmetric with normal eye closure and smile. CN VIII: Hearing is normal to rubbing fingers CN IX, X: Palate elevates symmetrically. Phonation is normal. CN XI: Head turning and shoulder shrug are intact CN XII: Tongue is midline with normal movements and no atrophy.  MOTOR: Mild bilateral hand intrinsic muscle atrophy, mild bilateral shoulder abduction, external rotation, elbow flexion weakness, left worse than right.  Mild spasticity of bilateral lower extremity, mild bilateral hip flexion weakness.  REFLEXES: Reflexes are 3 and symmetric at the biceps,   triceps, knees, and trace at ankles. Plantar responses are extensor bilaterally.  SENSORY: Length dependent decreased to light touch, pinprick and vibratory sensation to mid shin level  COORDINATION: Rapid alternating movements and fine finger movements are intact. There is no dysmetria on finger-to-nose and heel-knee-shin.    GAIT/STANCE: Needs pushed on his walker to get up from seated position, wide-based, cautious unsteady gait.  DIAGNOSTIC DATA (LABS, IMAGING, TESTING) - I reviewed patient records, labs, notes, testing and imaging myself where available.   ASSESSMENT AND PLAN  ONOFRIO KLEMP is a 77 y.o. male   Gait abnormality  Evidence of cervical spondylitic myelopathy, left cervical radiculopathy,  Will refer him to neurosurgeon for potential decompression,  His gait abnormality also multifactorial at this include obesity, deconditioning, chronic low back pain, only mild peripheral neuropathy,     Marcial Pacas, M.D. Ph.D.  [facility] [address], [place], [place] [address] Ph: [phone] Fax: [phone]  CC: Roger Lento, PA-C

## 2018-09-27 NOTE — Procedures (Signed)
Full Name: Roger Nelson Gender: Male MRN #: [id number] Date of Birth: [birth year]    Visit Date: 09/27/2018 11:48 Age: 77 Years 40 Months Old Examining Physician: Marcial Pacas, MD  Referring Physician: Krista Blue, MD History:   77 year old male with history of chronic neck pain, presenting with few years history of progressive worsening gait abnormality, bilateral feet paresthesia  On examination: He has mild bilateral shoulder abduction, external rotation, elbow flexion weakness left worse than right.  Mild spasticity of bilateral lower extremity mild bilateral hip flexion weakness.  Deep tendon reflexes are brisk bilaterally, with bilateral Babinski signs.  There is length dependent sensory changes to light touch pinprick and vibratory sensation to mid shin level.   Summary of the tests:  Nerve conduction study: Bilateral sural, superficial peroneal sensory responses showed normal peak latency, with moderately decreased to snap amplitude. Bilateral tibial, peroneal to EDB motor responses were normal.  Right median sensory response showed moderately prolonged peak latency with mildly decreased SNAP amplitude. Right ulnar sensory response was normal. Right ulnar response was normal. Right median motor response showed moderately prolonged distal latency, with mildly decreased the C map amplitude.  Electromyography: Selective needle examinations were performed at left upper, lower extremity muscles, cervical and lumbar paraspinal muscles.  There is significant chronic neuropathic changes at left deltoid, biceps, brachioradialis, lesser degree of pronator teres, There is also evidence of small complex motor unit potential at left upper cervical region.  Conclusion:  This is an abnormal study.  There is electrodiagnostic evidence of left cervical radiculopathy, mainly involving left C5-6 myotomes.  In addition, there is evidence of moderately severe right carpal tunnel syndrome.  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   ------------------------------- Marcial Pacas, M.D. PhD  [facility] [place], [place] [address] Tel: [phone] Fax: [phone]        [facility]    Nerve / Sites Muscle Latency Ref. Amplitude Ref. Rel Amp Segments Distance Velocity Ref. Area    ms ms mV mV %  cm m/s m/s mVms  R Median - APB     Wrist APB 6.3 ?4.4 3.8 ?4.0 100 Wrist - APB 7   11.0     Upper arm APB 11.6  3.6  94 Upper arm - Wrist 24 45 ?49 12.3  R Ulnar - ADM     Wrist ADM 3.3 ?3.3 11.7 ?6.0 100 Wrist - ADM 7   31.9     B.Elbow ADM 7.4  10.4  88.6 B.Elbow - Wrist 19 46 ?49 30.1     A.Elbow ADM 9.6  10.3  99 A.Elbow - B.Elbow 10 46 ?49 32.3         A.Elbow - Wrist      R Peroneal - EDB     Ankle EDB 5.2 ?6.5 4.1 ?2.0 100 Ankle - EDB 9   14.5     Fib head EDB 13.1  3.6  87.4 Fib head - Ankle 35 44 ?44 14.2     Pop fossa EDB 15.4  3.5  97.7 Pop fossa - Fib head 10 44 ?44 14.6         Pop fossa - Ankle      L Peroneal - EDB     Ankle EDB 6.0 ?6.5 3.8 ?2.0 100 Ankle - EDB 9   13.9     Fib head EDB 14.1  3.2  83.1 Fib head - Ankle  33 41 ?44 12.9     Pop fossa EDB 16.5  3.0  93.7 Pop fossa - Fib head 10 42 ?44 12.9         Pop fossa - Ankle      R Tibial - AH     Ankle AH 5.8 ?5.8 8.1 ?4.0 100 Ankle - AH 9   17.7     Pop fossa AH 15.4  5.3  64.6 Pop fossa - Ankle 37 38 ?41 13.4  L Tibial - AH     Ankle AH 5.0 ?5.8 9.2 ?4.0 100 Ankle - AH 9   23.7     Pop fossa AH 15.1  8.7  95.1 Pop fossa - Ankle 38 38 ?41 27.0                  SNC    Nerve / Sites Rec. Site Peak Lat Ref.  Amp Ref. Segments Distance    ms ms V V  cm  R Sural - Ankle (Calf)     Calf Ankle 3.9 ?4.4 3 ?6 Calf - Ankle 14  L Sural - Ankle (Calf)     Calf Ankle 4.0 ?4.4 3 ?6 Calf - Ankle 14  R Superficial peroneal - Ankle     Lat leg Ankle 4.0 ?4.4 3 ?6 Lat leg - Ankle 14  L Superficial peroneal - Ankle     Lat  leg Ankle 4.4 ?4.4 3 ?6 Lat leg - Ankle 14  R Median - Orthodromic (Dig II, Mid palm)     Dig II Wrist 4.8 ?3.4 7 ?10 Dig II - Wrist 13  R Ulnar - Orthodromic, (Dig V, Mid palm)     Dig V Wrist 3.1 ?3.1 7 ?5 Dig V - Wrist 86                 F  Wave    Nerve F Lat Ref.   ms ms  R Tibial - AH 57.7 ?56.0  L Tibial - AH 58.2 ?56.0  R Ulnar - ADM 32.2 ?32.0           EMG full       EMG Summary Table    Spontaneous MUAP Recruitment  Muscle IA Fib PSW Fasc Other Amp Dur. Poly Pattern  L. Tibialis anterior Normal None None None _______ Normal Normal Normal Normal  L. Tibialis posterior Normal None None None _______ Normal Normal Normal Normal  L. Peroneus longus Normal None None None _______ Normal Normal Normal Normal  L. Gastrocnemius (Medial head) Normal None None None _______ Normal Normal Normal Normal  L. Vastus lateralis Normal None None None _______ Normal Normal Normal Normal  L. Lumbar paraspinals (mid) Normal None None None _______ Normal Normal Normal Normal  L. Lumbar paraspinals (low) Normal None None None _______ Normal Normal Normal Normal  L. First dorsal interosseous Normal None None None _______ Normal Normal Normal Normal  L. Extensor digitorum communis Increased None None None _______ Increased Increased 1+ Reduced  L. Pronator teres Increased None None None _______ Increased Increased 1+ Reduced  L. Brachioradialis Increased None None None _______ Increased Increased 1+ Reduced  L. Deltoid Increased None None None _______ Increased Increased 1+ Reduced  L. Biceps brachii Normal None None None _______ Increased Increased 2+ Reduced  L. Triceps brachii Normal None None None _______ Normal Increased Normal Reduced  L. Cervical paraspinals Normal None None None _______ Increased Increased 1+ Normal

## 2018-09-30 NOTE — Telephone Encounter (Signed)
Order has been sent for first avab.

## 2018-10-17 ENCOUNTER — Telehealth: Payer: Self-pay | Admitting: Neurology

## 2018-10-17 NOTE — Telephone Encounter (Signed)
MaryJane/[facility] advised the pt is wanting to see Dr Sheppard Coil Powell-neurosurgeon/[facility] (p) [phone]  ([phone].

## 2018-10-21 ENCOUNTER — Telehealth: Payer: Self-pay | Admitting: Neurology

## 2018-10-21 NOTE — Telephone Encounter (Signed)
Pt is wanting Thurman Coyer, at [facility], see other note.

## 2018-10-21 NOTE — Telephone Encounter (Signed)
I have sent referral to [facility] and calling the are processing . I will call patient and make him aware.

## 2018-10-21 NOTE — Telephone Encounter (Signed)
Spoke to Patient so We are clear Patient wants to go to Roxanne Mins, MD  [facility] -  [address] [place], [place] [address] [phone]  -- [phone].  Referral has been faxed and patient is aware.

## 2018-10-21 NOTE — Telephone Encounter (Signed)
Pt called in wanting a referral for a back surgeon and does not want it to be [facility]. Please advise.

## 2019-01-01 ENCOUNTER — Inpatient Hospital Stay
Admission: RE | Admit: 2019-01-01 | Discharge: 2019-02-14 | Disposition: E | Payer: Medicare Other | Attending: Internal Medicine | Admitting: Internal Medicine

## 2019-01-01 ENCOUNTER — Other Ambulatory Visit (HOSPITAL_COMMUNITY): Payer: Self-pay

## 2019-01-01 DIAGNOSIS — N179 Acute kidney failure, unspecified: Secondary | ICD-10-CM

## 2019-01-01 DIAGNOSIS — J9690 Respiratory failure, unspecified, unspecified whether with hypoxia or hypercapnia: Secondary | ICD-10-CM

## 2019-01-01 DIAGNOSIS — J90 Pleural effusion, not elsewhere classified: Secondary | ICD-10-CM

## 2019-01-01 DIAGNOSIS — M5412 Radiculopathy, cervical region: Secondary | ICD-10-CM | POA: Diagnosis present

## 2019-01-01 DIAGNOSIS — J9621 Acute and chronic respiratory failure with hypoxia: Secondary | ICD-10-CM | POA: Diagnosis present

## 2019-01-01 DIAGNOSIS — M4712 Other spondylosis with myelopathy, cervical region: Secondary | ICD-10-CM | POA: Diagnosis present

## 2019-01-01 DIAGNOSIS — Z452 Encounter for adjustment and management of vascular access device: Secondary | ICD-10-CM

## 2019-01-01 DIAGNOSIS — J189 Pneumonia, unspecified organism: Secondary | ICD-10-CM

## 2019-01-01 DIAGNOSIS — Z9911 Dependence on respirator [ventilator] status: Secondary | ICD-10-CM

## 2019-01-01 DIAGNOSIS — J111 Influenza due to unidentified influenza virus with other respiratory manifestations: Secondary | ICD-10-CM

## 2019-01-01 DIAGNOSIS — J159 Unspecified bacterial pneumonia: Secondary | ICD-10-CM | POA: Diagnosis present

## 2019-01-01 DIAGNOSIS — T17908A Unspecified foreign body in respiratory tract, part unspecified causing other injury, initial encounter: Secondary | ICD-10-CM

## 2019-01-01 DIAGNOSIS — A419 Sepsis, unspecified organism: Secondary | ICD-10-CM

## 2019-01-01 DIAGNOSIS — Z931 Gastrostomy status: Secondary | ICD-10-CM

## 2019-01-01 DIAGNOSIS — R4182 Altered mental status, unspecified: Secondary | ICD-10-CM | POA: Diagnosis present

## 2019-01-01 DIAGNOSIS — Z992 Dependence on renal dialysis: Secondary | ICD-10-CM

## 2019-01-01 DIAGNOSIS — D72829 Elevated white blood cell count, unspecified: Secondary | ICD-10-CM

## 2019-01-01 DIAGNOSIS — I482 Chronic atrial fibrillation, unspecified: Secondary | ICD-10-CM | POA: Diagnosis present

## 2019-01-01 DIAGNOSIS — R0603 Acute respiratory distress: Secondary | ICD-10-CM

## 2019-01-01 HISTORY — DX: Other spondylosis with myelopathy, cervical region: M47.12

## 2019-01-01 HISTORY — DX: Acute and chronic respiratory failure with hypoxia: J96.21

## 2019-01-01 HISTORY — DX: Radiculopathy, cervical region: M54.12

## 2019-01-01 HISTORY — DX: Disease of spinal cord, unspecified: G95.9

## 2019-01-01 HISTORY — DX: Altered mental status, unspecified: R41.82

## 2019-01-01 HISTORY — DX: Chronic atrial fibrillation, unspecified: I48.20

## 2019-01-01 HISTORY — DX: Unspecified bacterial pneumonia: J15.9

## 2019-01-02 ENCOUNTER — Other Ambulatory Visit: Payer: Self-pay

## 2019-01-02 DIAGNOSIS — M4712 Other spondylosis with myelopathy, cervical region: Secondary | ICD-10-CM

## 2019-01-02 DIAGNOSIS — I482 Chronic atrial fibrillation, unspecified: Secondary | ICD-10-CM

## 2019-01-02 DIAGNOSIS — J159 Unspecified bacterial pneumonia: Secondary | ICD-10-CM

## 2019-01-02 DIAGNOSIS — R4182 Altered mental status, unspecified: Secondary | ICD-10-CM | POA: Diagnosis not present

## 2019-01-02 DIAGNOSIS — J9621 Acute and chronic respiratory failure with hypoxia: Secondary | ICD-10-CM | POA: Diagnosis not present

## 2019-01-02 LAB — CBC WITH DIFFERENTIAL/PLATELET
Abs Immature Granulocytes: 0.1 10*3/uL — ABNORMAL HIGH (ref 0.00–0.07)
Basophils Absolute: 0 10*3/uL (ref 0.0–0.1)
Basophils Relative: 0 %
Eosinophils Absolute: 0 10*3/uL (ref 0.0–0.5)
Eosinophils Relative: 0 %
HCT: 29.3 % — ABNORMAL LOW (ref 39.0–52.0)
HEMOGLOBIN: 9.4 g/dL — AB (ref 13.0–17.0)
Immature Granulocytes: 1 %
LYMPHS PCT: 10 %
Lymphs Abs: 0.8 10*3/uL (ref 0.7–4.0)
MCH: 30.9 pg (ref 26.0–34.0)
MCHC: 32.1 g/dL (ref 30.0–36.0)
MCV: 96.4 fL (ref 80.0–100.0)
Monocytes Absolute: 0.2 10*3/uL (ref 0.1–1.0)
Monocytes Relative: 3 %
Neutro Abs: 7.4 10*3/uL (ref 1.7–7.7)
Neutrophils Relative %: 86 %
Platelets: 84 10*3/uL — ABNORMAL LOW (ref 150–400)
RBC: 3.04 MIL/uL — ABNORMAL LOW (ref 4.22–5.81)
RDW: 15.9 % — ABNORMAL HIGH (ref 11.5–15.5)
WBC: 8.5 10*3/uL (ref 4.0–10.5)
nRBC: 0 % (ref 0.0–0.2)

## 2019-01-02 LAB — COMPREHENSIVE METABOLIC PANEL
ALBUMIN: 1.7 g/dL — AB (ref 3.5–5.0)
ALT: 87 U/L — ABNORMAL HIGH (ref 0–44)
AST: 32 U/L (ref 15–41)
Alkaline Phosphatase: 86 U/L (ref 38–126)
Anion gap: 7 (ref 5–15)
BUN: 28 mg/dL — ABNORMAL HIGH (ref 8–23)
CHLORIDE: 107 mmol/L (ref 98–111)
CO2: 24 mmol/L (ref 22–32)
Calcium: 7.5 mg/dL — ABNORMAL LOW (ref 8.9–10.3)
Creatinine, Ser: 0.66 mg/dL (ref 0.61–1.24)
GFR calc Af Amer: >60 mL/min (ref >60)
GFR calc non Af Amer: >60 mL/min (ref >60)
Glucose, Bld: 142 mg/dL — ABNORMAL HIGH (ref 70–99)
Potassium: 2.6 mmol/L — CL (ref 3.5–5.1)
Sodium: 138 mmol/L (ref 135–145)
Total Bilirubin: 0.7 mg/dL (ref 0.3–1.2)
Total Protein: 4.3 g/dL — ABNORMAL LOW (ref 6.5–8.1)

## 2019-01-02 LAB — PROTIME-INR
INR: 1.2 (ref 0.8–1.2)
Prothrombin Time: 15.2 seconds (ref 11.4–15.2)

## 2019-01-02 LAB — VANCOMYCIN, TROUGH: Vancomycin Tr: 21 ug/mL (ref 15–20)

## 2019-01-02 LAB — HEMOGLOBIN A1C
Hgb A1c MFr Bld: 6.1 % — ABNORMAL HIGH (ref 4.8–5.6)
Mean Plasma Glucose: 128.37 mg/dL

## 2019-01-02 LAB — PHOSPHORUS: Phosphorus: 3.4 mg/dL (ref 2.5–4.6)

## 2019-01-02 LAB — TSH: TSH: 0.718 u[IU]/mL (ref 0.350–4.500)

## 2019-01-02 LAB — BRAIN NATRIURETIC PEPTIDE: B Natriuretic Peptide: 604.8 pg/mL — ABNORMAL HIGH (ref 0.0–100.0)

## 2019-01-02 MED ORDER — PHENOL 1.4 % MT LIQD
2.00 | OROMUCOSAL | Status: DC
Start: ? — End: 2019-01-02

## 2019-01-02 MED ORDER — INSULIN GLARGINE 100 UNIT/ML ~~LOC~~ SOLN
1.00 | SUBCUTANEOUS | Status: DC
Start: 2019-01-01 — End: 2019-01-02

## 2019-01-02 MED ORDER — SODIUM CHLORIDE 0.9 % IV SOLN
10.00 | INTRAVENOUS | Status: DC
Start: ? — End: 2019-01-02

## 2019-01-02 MED ORDER — MELATONIN 1 MG PO TABS
3.00 | ORAL_TABLET | ORAL | Status: DC
Start: 2019-01-01 — End: 2019-01-02

## 2019-01-02 MED ORDER — AMIODARONE HCL 200 MG PO TABS
200.00 | ORAL_TABLET | ORAL | Status: DC
Start: 2019-01-01 — End: 2019-01-02

## 2019-01-02 MED ORDER — GENERIC EXTERNAL MEDICATION
Status: DC
Start: ? — End: 2019-01-02

## 2019-01-02 MED ORDER — DEXTROSE 10 % IV SOLN
75.00 | INTRAVENOUS | Status: DC
Start: ? — End: 2019-01-02

## 2019-01-02 MED ORDER — FIRST-LANSOPRAZOLE 3 MG/ML PO SUSP
30.00 | ORAL | Status: DC
Start: 2019-01-02 — End: 2019-01-02

## 2019-01-02 MED ORDER — ALBUTEROL SULFATE (2.5 MG/3ML) 0.083% IN NEBU
2.50 | INHALATION_SOLUTION | RESPIRATORY_TRACT | Status: DC
Start: ? — End: 2019-01-02

## 2019-01-02 MED ORDER — ACETAMINOPHEN 325 MG PO TABS
650.00 | ORAL_TABLET | ORAL | Status: DC
Start: ? — End: 2019-01-02

## 2019-01-02 MED ORDER — ENOXAPARIN SODIUM 40 MG/0.4ML ~~LOC~~ SOLN
40.00 | SUBCUTANEOUS | Status: DC
Start: 2019-01-02 — End: 2019-01-02

## 2019-01-02 MED ORDER — FLUOXETINE HCL 20 MG PO CAPS
20.00 | ORAL_CAPSULE | ORAL | Status: DC
Start: 2019-01-02 — End: 2019-01-02

## 2019-01-02 MED ORDER — ACETAMINOPHEN 650 MG RE SUPP
650.00 | RECTAL | Status: DC
Start: ? — End: 2019-01-02

## 2019-01-02 MED ORDER — LOPERAMIDE HCL 2 MG PO CAPS
2.00 | ORAL_CAPSULE | ORAL | Status: DC
Start: ? — End: 2019-01-02

## 2019-01-02 MED ORDER — HYDROCODONE-ACETAMINOPHEN 7.5-325 MG PO TABS
1.00 | ORAL_TABLET | ORAL | Status: DC
Start: ? — End: 2019-01-02

## 2019-01-02 MED ORDER — LISINOPRIL 20 MG PO TABS
20.00 | ORAL_TABLET | ORAL | Status: DC
Start: 2019-01-02 — End: 2019-01-02

## 2019-01-02 MED ORDER — HYDRALAZINE HCL 20 MG/ML IJ SOLN
20.00 | INTRAMUSCULAR | Status: DC
Start: ? — End: 2019-01-02

## 2019-01-02 MED ORDER — GENERIC EXTERNAL MEDICATION
1.75 | Status: DC
Start: 2019-01-02 — End: 2019-01-02

## 2019-01-02 MED ORDER — GENERIC EXTERNAL MEDICATION
2.00 | Status: DC
Start: 2019-01-02 — End: 2019-01-02

## 2019-01-02 MED ORDER — INSULIN GLARGINE 100 UNIT/ML ~~LOC~~ SOLN
1.00 | SUBCUTANEOUS | Status: DC
Start: ? — End: 2019-01-02

## 2019-01-02 MED ORDER — DILTIAZEM HCL 30 MG PO TABS
30.00 | ORAL_TABLET | ORAL | Status: DC
Start: 2019-01-01 — End: 2019-01-02

## 2019-01-02 MED ORDER — INSULIN LISPRO 100 UNIT/ML ~~LOC~~ SOLN
1.00 | SUBCUTANEOUS | Status: DC
Start: 2019-01-01 — End: 2019-01-02

## 2019-01-02 MED ORDER — INSULIN LISPRO 100 UNIT/ML ~~LOC~~ SOLN
1.00 | SUBCUTANEOUS | Status: DC
Start: ? — End: 2019-01-02

## 2019-01-02 NOTE — Consult Note (Signed)
Pulmonary [place]  Date of Service: 01/02/2019  PULMONARY CRITICAL CARE CONSULT   Roger Nelson  IPJ:[id number]  DOB: 02-09-1941   DOA: 12/31/2018  Referring Physician: Merton Border, MD  HPI: Roger Nelson is a 78 y.o. male seen for follow up of Acute on Chronic Respiratory Failure.  Patient with multiple medical problems including congestive heart failure sleep apnea cervical stenosis myelopathy status post anterior cervical corpectomy C3-4-5 6 decompression and fusion which was done in February postoperatively patient had difficulty with breathing and ended up intubated subsequently ended up having to have a tracheostomy done along with a PEG tube placement.  Patient also has history of atrial fibrillation with rapid ventricular response.  Patient has been on anticoagulation for this.  Hospital course at the other facility the patient presented with a chest x-ray showing left lower lobe atelectasis versus consolidation.  Patient was treated accordingly.  Subsequently was transferred to our facility for further management.  Review of Systems:  ROS performed and is unremarkable other than noted above.  Past Medical History:  Diagnosis Date  . A-fib ([place])   . Anxiety    having panic attacks awhile ago  . Cancer ([place])    melanoma in his eye right--wears prosthetic  . Chronic back pain   . Coronary artery disease    found out about it 6-7 yrs ago. 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 Depression   . Diabetes mellitus without complication (HCC)    borderline  . Dysrhythmia    palpitations caused by HCTZ  . GERD (gastroesophageal reflux disease)    takes OTC every now and then  . Headache    h/o migraines--last one was 6 mths  . Heart disease   . Hypertension   . Leg weakness   . PONV (postoperative nausea and vomiting)   . Sleep apnea    tested about 2 yrs ago. has bipap    Past Surgical History:  Procedure Laterality  Date  . APPENDECTOMY    . CHOLECYSTECTOMY    . colon was twisted     medically treated  . EYE SURGERY     right eye removed d/t cancer  . FRACTURE SURGERY     right arm  . HERNIA REPAIR     umbilical  . JOINT REPLACEMENT     bilateral hips  . SHOULDER SURGERY    . SPINAL FUSION  12-2014   Dr. Hal Nelson  . SPINAL FUSION  2018   Dr. Owens Shark  . TONSILLECTOMY      Social History:    reports that he has quit smoking. His smoking use included cigarettes. He has never used smokeless tobacco. He reports that he does not drink alcohol or use drugs.  Family History: Non-Contributory to the present illness  No Known Allergies  Medications: Reviewed on Rounds  Physical Exam:  Vitals: Temperature 98.1 pulse 96 respiratory 12 blood pressure 121/66 saturations 100%  Ventilator Settings mode ventilation pressure assist control FiO2 28% tidal volume 448 PEEP 5  . General: Comfortable at this time . Eyes: Grossly normal lids, irises & conjunctiva . ENT: grossly tongue is normal . Neck: no obvious mass . Cardiovascular: S1-S2 normal no gallop or rub is noted . Respiratory: Scattered rhonchi expansion is equal . Abdomen: Soft and nontender . Skin: no rash seen on limited exam . Musculoskeletal: not rigid . Psychiatric:unable to assess . Neurologic: no seizure no involuntary movements  Labs on Admission:  Basic Metabolic Panel: Recent Labs  Lab 01/02/19 0613  NA 138  K 2.6*  CL 107  CO2 24  GLUCOSE 142*  BUN 28*  CREATININE 0.66  CALCIUM 7.5*  PHOS 3.4    No results for input(s): PHART, PCO2ART, PO2ART, HCO3, O2SAT in the last 168 hours.  Liver Function Tests: Recent Labs  Lab 01/02/19 0613  AST 32  ALT 87*  ALKPHOS 86  BILITOT 0.7  PROT 4.3*  ALBUMIN 1.7*   No results for input(s): LIPASE, AMYLASE in the last 168 hours. No results for input(s): AMMONIA in the last 168 hours.  CBC: Recent Labs  Lab 01/02/19 0613  WBC 8.5  NEUTROABS 7.4  HGB 9.4*   HCT 29.3*  MCV 96.4  PLT 84*    Cardiac Enzymes: No results for input(s): CKTOTAL, CKMB, CKMBINDEX, TROPONINI in the last 168 hours.  BNP (last 3 results) Recent Labs    01/02/19 0613  BNP 604.8*    ProBNP (last 3 results) No results for input(s): PROBNP in the last 8760 hours.   Radiological Exams on Admission: Dg Abdomen Peg Tube Location  Result Date: 01/06/2019 CLINICAL DATA:  Peg tube position EXAM: ABDOMEN - 1 VIEW COMPARISON:  CT 07/12/2017 FINDINGS: 50 cc Omnipaque with injected through the patient's PEG tube. Radiolucent balloon projects over the distal gastric body. Contrast opacifies the stomach and proximal small bowel. No extravasation. Surgical hardware in the spine and hips. IMPRESSION: Peg tube position within the distal stomach.  No extravasation Electronically Signed   By: Donavan Foil M.D.   On: 12/31/2018 19:52   Dg Chest Port 1 View  Result Date: 12/31/2018 CLINICAL DATA:  Respiratory failure EXAM: PORTABLE CHEST 1 VIEW COMPARISON:  12/30/2014 FINDINGS: Tracheostomy tube tip about 3.5 cm superior to the carina. Postsurgical changes in the cervical spine. Small left pleural effusion with left basilar atelectasis or pneumonia. Mild cardiomegaly. Left upper extremity catheter tip faintly visible over the cavoatrial region IMPRESSION: 1. Small left pleural effusion with left basilar atelectasis or pneumonia. 2. Mild cardiomegaly Electronically Signed   By: Donavan Foil M.D.   On: 12/16/2018 19:40    Assessment/Plan Active Problems:   Acute on chronic respiratory failure with hypoxia (HCC)   Bacterial lobar pneumonia   Cervical myelopathy with cervical radiculopathy   Altered mental status   Chronic atrial fibrillation   1. Acute on chronic respiratory failure hypoxia at this time patient is on full support pressure control FiO2 28% tidal volume 448 patient was able to do 2 hours of pressure support mode patient has been noted to have some periods of apnea  will need to continue to monitor this closely as we move forward.  Also will need to monitor his sedation status 2. Lobar pneumonia and likely MRSA and Klebsiella patient was treated with antibiotics we will continue to follow radiologically. 3. Cervical myelopathy status post decompression we will continue with present management physical therapy as tolerated 4. Altered mental status slowly improving patient had been seen and evaluated by neurosurgery. 5. Chronic atrial fibrillation rate is controlled we will continue with anticoagulation  I have personally seen and evaluated the patient, evaluated laboratory and imaging results, formulated the assessment and plan and placed orders. The Patient requires high complexity decision making for assessment and support.  Case was discussed on Rounds with the Respiratory Therapy Staff Time Spent 78minutes  Allyne Gee, MD [facility] Pulmonary Critical Care Medicine Sleep Medicine

## 2019-01-03 ENCOUNTER — Encounter: Payer: Self-pay | Admitting: Internal Medicine

## 2019-01-03 DIAGNOSIS — J9621 Acute and chronic respiratory failure with hypoxia: Secondary | ICD-10-CM | POA: Diagnosis present

## 2019-01-03 DIAGNOSIS — M5412 Radiculopathy, cervical region: Secondary | ICD-10-CM | POA: Diagnosis present

## 2019-01-03 DIAGNOSIS — M4712 Other spondylosis with myelopathy, cervical region: Secondary | ICD-10-CM | POA: Diagnosis not present

## 2019-01-03 DIAGNOSIS — I482 Chronic atrial fibrillation, unspecified: Secondary | ICD-10-CM | POA: Diagnosis present

## 2019-01-03 DIAGNOSIS — R4182 Altered mental status, unspecified: Secondary | ICD-10-CM | POA: Diagnosis present

## 2019-01-03 DIAGNOSIS — J159 Unspecified bacterial pneumonia: Secondary | ICD-10-CM | POA: Diagnosis present

## 2019-01-03 LAB — CBC
HCT: 28.9 % — ABNORMAL LOW (ref 39.0–52.0)
HEMOGLOBIN: 9.3 g/dL — AB (ref 13.0–17.0)
MCH: 30 pg (ref 26.0–34.0)
MCHC: 32.2 g/dL (ref 30.0–36.0)
MCV: 93.2 fL (ref 80.0–100.0)
Platelets: 70 10*3/uL — ABNORMAL LOW (ref 150–400)
RBC: 3.1 MIL/uL — ABNORMAL LOW (ref 4.22–5.81)
RDW: 16.1 % — ABNORMAL HIGH (ref 11.5–15.5)
WBC: 7.5 10*3/uL (ref 4.0–10.5)
nRBC: 0 % (ref 0.0–0.2)

## 2019-01-03 LAB — BASIC METABOLIC PANEL
Anion gap: 9 (ref 5–15)
BUN: 27 mg/dL — ABNORMAL HIGH (ref 8–23)
CALCIUM: 7.6 mg/dL — AB (ref 8.9–10.3)
CO2: 24 mmol/L (ref 22–32)
CREATININE: 0.61 mg/dL (ref 0.61–1.24)
Chloride: 108 mmol/L (ref 98–111)
GFR calc Af Amer: >60 mL/min (ref >60)
GFR calc non Af Amer: >60 mL/min (ref >60)
Glucose, Bld: 141 mg/dL — ABNORMAL HIGH (ref 70–99)
Potassium: 3.8 mmol/L (ref 3.5–5.1)
Sodium: 141 mmol/L (ref 135–145)

## 2019-01-03 LAB — MAGNESIUM: Magnesium: 2.4 mg/dL (ref 1.7–2.4)

## 2019-01-03 LAB — PHOSPHORUS: Phosphorus: 2.8 mg/dL (ref 2.5–4.6)

## 2019-01-03 MED ORDER — GENERIC EXTERNAL MEDICATION
Status: DC
Start: ? — End: 2019-01-03

## 2019-01-03 NOTE — Progress Notes (Signed)
Pulmonary Critical Care Medicine [place]   PULMONARY CRITICAL CARE SERVICE  PROGRESS NOTE  Date of Service: 01/03/2019  Roger Nelson  [id number]  DOB: [birth year]   DOA: 12/17/2018  Referring Physician: Merton Border, MD  HPI: Roger Nelson is a 78 y.o. male seen for follow up of Acute on Chronic Respiratory Failure.  At this time patient is on pressure support weaning doing fairly well goal is for about 8 hours  Medications: Reviewed on Rounds  Physical Exam:  Vitals: Temperature 97.8 pulse 100 respiratory 16 blood pressure 127/60 saturations 100%  Ventilator Settings mode ventilation pressure support FiO2 is 28% tidal volume 688 pressure poor 12 PEEP 5  . General: Comfortable at this time . Eyes: Grossly normal lids, irises & conjunctiva . ENT: grossly tongue is normal . Neck: no obvious mass . Cardiovascular: S1 S2 normal no gallop . Respiratory: No rhonchi or rales are noted at this time . Abdomen: soft . Skin: no rash seen on limited exam . Musculoskeletal: not rigid . Psychiatric:unable to assess . Neurologic: no seizure no involuntary movements         Lab Data:   Basic Metabolic Panel: Recent Labs  Lab 01/02/19 0613  NA 138  K 2.6*  CL 107  CO2 24  GLUCOSE 142*  BUN 28*  CREATININE 0.66  CALCIUM 7.5*  PHOS 3.4    ABG: No results for input(s): PHART, PCO2ART, PO2ART, HCO3, O2SAT in the last 168 hours.  Liver Function Tests: Recent Labs  Lab 01/02/19 0613  AST 32  ALT 87*  ALKPHOS 86  BILITOT 0.7  PROT 4.3*  ALBUMIN 1.7*   No results for input(s): LIPASE, AMYLASE in the last 168 hours. No results for input(s): AMMONIA in the last 168 hours.  CBC: Recent Labs  Lab 01/02/19 0613  WBC 8.5  NEUTROABS 7.4  HGB 9.4*  HCT 29.3*  MCV 96.4  PLT 84*    Cardiac Enzymes: No results for input(s): CKTOTAL, CKMB, CKMBINDEX, TROPONINI in the last 168 hours.  BNP (last 3 results) Recent Labs    01/02/19 0613  BNP  604.8*    ProBNP (last 3 results) No results for input(s): PROBNP in the last 8760 hours.  Radiological Exams: Dg Abdomen Peg Tube Location  Result Date: 01/10/2019 CLINICAL DATA:  Peg tube position EXAM: ABDOMEN - 1 VIEW COMPARISON:  CT 07/12/2017 FINDINGS: 50 cc Omnipaque with injected through the patient's PEG tube. Radiolucent balloon projects over the distal gastric body. Contrast opacifies the stomach and proximal small bowel. No extravasation. Surgical hardware in the spine and hips. IMPRESSION: Peg tube position within the distal stomach.  No extravasation Electronically Signed   By: Donavan Foil M.D.   On: 01/05/2019 19:52   Dg Chest Port 1 View  Result Date: 01/04/2019 CLINICAL DATA:  Respiratory failure EXAM: PORTABLE CHEST 1 VIEW COMPARISON:  12/30/2014 FINDINGS: Tracheostomy tube tip about 3.5 cm superior to the carina. Postsurgical changes in the cervical spine. Small left pleural effusion with left basilar atelectasis or pneumonia. Mild cardiomegaly. Left upper extremity catheter tip faintly visible over the cavoatrial region IMPRESSION: 1. Small left pleural effusion with left basilar atelectasis or pneumonia. 2. Mild cardiomegaly Electronically Signed   By: Donavan Foil M.D.   On: 01/07/2019 19:40    Assessment/Plan Active Problems:   Acute on chronic respiratory failure with hypoxia (HCC)   Bacterial lobar pneumonia   Cervical myelopathy with cervical radiculopathy   Altered mental status   Chronic  atrial fibrillation   1. Acute on chronic respiratory failure hypoxia continue with the wean the goal is 8 hours which he is tolerating fairly well. 2. Bacterial lobar pneumonia treated we will continue present management 3. Cervical myelopathy with radiculopathy at baseline continue supportive care 4. Altered mental status at baseline 5. Chronic atrial fibrillation rate is controlled at this time   I have personally seen and evaluated the patient, evaluated laboratory  and imaging results, formulated the assessment and plan and placed orders. The Patient requires high complexity decision making for assessment and support.  Case was discussed on Rounds with the Respiratory Therapy Staff  Allyne Gee, MD [facility] Pulmonary Critical Care Medicine Sleep Medicine

## 2019-01-04 DIAGNOSIS — M4712 Other spondylosis with myelopathy, cervical region: Secondary | ICD-10-CM | POA: Diagnosis not present

## 2019-01-04 DIAGNOSIS — R4182 Altered mental status, unspecified: Secondary | ICD-10-CM | POA: Diagnosis not present

## 2019-01-04 DIAGNOSIS — J9621 Acute and chronic respiratory failure with hypoxia: Secondary | ICD-10-CM | POA: Diagnosis not present

## 2019-01-04 DIAGNOSIS — J159 Unspecified bacterial pneumonia: Secondary | ICD-10-CM | POA: Diagnosis not present

## 2019-01-04 NOTE — Progress Notes (Signed)
Pulmonary Critical Care Medicine [place]   PULMONARY CRITICAL CARE SERVICE  PROGRESS NOTE  Date of Service: 01/04/2019  Roger Nelson  [id number]  DOB: 1941/06/09   DOA: 12/18/2018  Referring Physician: Merton Border, MD  HPI: Roger Nelson is a 78 y.o. male seen for follow up of Acute on Chronic Respiratory Failure.  Patient is on pressure support with a 12-hour goal today  Medications: Reviewed on Rounds  Physical Exam:  Vitals: Temperature 97.3 pulse 104 respiratory rate 15 blood pressure 141/79 saturations 100%  Ventilator Settings mode ventilation pressure support FiO2 28% tidal volume 521 pressure support 12 PEEP  . General: Comfortable at this time . Eyes: Grossly normal lids, irises & conjunctiva . ENT: grossly tongue is normal . Neck: no obvious mass . Cardiovascular: S1 S2 normal no gallop . Respiratory: No rhonchi or rales are noted at this time . Abdomen: soft . Skin: no rash seen on limited exam . Musculoskeletal: not rigid . Psychiatric:unable to assess . Neurologic: no seizure no involuntary movements         Lab Data:   Basic Metabolic Panel: Recent Labs  Lab 01/02/19 0613 01/03/19 1400  NA 138 141  K 2.6* 3.8  CL 107 108  CO2 24 24  GLUCOSE 142* 141*  BUN 28* 27*  CREATININE 0.66 0.61  CALCIUM 7.5* 7.6*  MG  --  2.4  PHOS 3.4 2.8    ABG: No results for input(s): PHART, PCO2ART, PO2ART, HCO3, O2SAT in the last 168 hours.  Liver Function Tests: Recent Labs  Lab 01/02/19 0613  AST 32  ALT 87*  ALKPHOS 86  BILITOT 0.7  PROT 4.3*  ALBUMIN 1.7*   No results for input(s): LIPASE, AMYLASE in the last 168 hours. No results for input(s): AMMONIA in the last 168 hours.  CBC: Recent Labs  Lab 01/02/19 0613 01/03/19 1400  WBC 8.5 7.5  NEUTROABS 7.4  --   HGB 9.4* 9.3*  HCT 29.3* 28.9*  MCV 96.4 93.2  PLT 84* 70*    Cardiac Enzymes: No results for input(s): CKTOTAL, CKMB, CKMBINDEX, TROPONINI in the last  168 hours.  BNP (last 3 results) Recent Labs    01/02/19 0613  BNP 604.8*    ProBNP (last 3 results) No results for input(s): PROBNP in the last 8760 hours.  Radiological Exams: No results found.  Assessment/Plan Active Problems:   Acute on chronic respiratory failure with hypoxia (HCC)   Bacterial lobar pneumonia   Cervical myelopathy with cervical radiculopathy   Altered mental status   Chronic atrial fibrillation   1. Acute on chronic respiratory failure with hypoxia continue weaning on pressure support for a goal 2. Cervical myelopathy status post ACDF 3. Altered mental status grossly unchanged 4. Bacterial pneumonia treated we will continue with supportive care 5. Chronic atrial fibrillation rate controlled   I have personally seen and evaluated the patient, evaluated laboratory and imaging results, formulated the assessment and plan and placed orders. The Patient requires high complexity decision making for assessment and support.  Case was discussed on Rounds with the Respiratory Therapy Staff  Allyne Gee, MD [facility] Pulmonary Critical Care Medicine Sleep Medicine

## 2019-01-05 DIAGNOSIS — M4712 Other spondylosis with myelopathy, cervical region: Secondary | ICD-10-CM | POA: Diagnosis not present

## 2019-01-05 DIAGNOSIS — J9621 Acute and chronic respiratory failure with hypoxia: Secondary | ICD-10-CM | POA: Diagnosis not present

## 2019-01-05 DIAGNOSIS — R4182 Altered mental status, unspecified: Secondary | ICD-10-CM | POA: Diagnosis not present

## 2019-01-05 DIAGNOSIS — J159 Unspecified bacterial pneumonia: Secondary | ICD-10-CM | POA: Diagnosis not present

## 2019-01-05 LAB — BASIC METABOLIC PANEL
ANION GAP: 10 (ref 5–15)
BUN: 25 mg/dL — ABNORMAL HIGH (ref 8–23)
CO2: 23 mmol/L (ref 22–32)
Calcium: 7.7 mg/dL — ABNORMAL LOW (ref 8.9–10.3)
Chloride: 108 mmol/L (ref 98–111)
Creatinine, Ser: 0.6 mg/dL — ABNORMAL LOW (ref 0.61–1.24)
GFR calc non Af Amer: >60 mL/min (ref >60)
Glucose, Bld: 153 mg/dL — ABNORMAL HIGH (ref 70–99)
Potassium: 3.6 mmol/L (ref 3.5–5.1)
Sodium: 141 mmol/L (ref 135–145)

## 2019-01-05 LAB — CBC
HCT: 30.7 % — ABNORMAL LOW (ref 39.0–52.0)
Hemoglobin: 9.4 g/dL — ABNORMAL LOW (ref 13.0–17.0)
MCH: 29.5 pg (ref 26.0–34.0)
MCHC: 30.6 g/dL (ref 30.0–36.0)
MCV: 96.2 fL (ref 80.0–100.0)
Platelets: 124 10*3/uL — ABNORMAL LOW (ref 150–400)
RBC: 3.19 MIL/uL — ABNORMAL LOW (ref 4.22–5.81)
RDW: 16.1 % — ABNORMAL HIGH (ref 11.5–15.5)
WBC: 6.2 10*3/uL (ref 4.0–10.5)
nRBC: 0 % (ref 0.0–0.2)

## 2019-01-05 LAB — VANCOMYCIN, TROUGH: Vancomycin Tr: 28 ug/mL (ref 15–20)

## 2019-01-05 LAB — PHOSPHORUS: Phosphorus: 2.7 mg/dL (ref 2.5–4.6)

## 2019-01-05 LAB — MAGNESIUM: MAGNESIUM: 2.4 mg/dL (ref 1.7–2.4)

## 2019-01-05 NOTE — Progress Notes (Signed)
Pulmonary Critical Care Medicine [place]   PULMONARY CRITICAL CARE SERVICE  PROGRESS NOTE  Date of Service: 01/05/2019  Roger Nelson  IWP:[id number]  DOB: 1941/06/25   DOA: 12/28/2018  Referring Physician: Merton Border, MD  HPI: Roger Nelson is a 78 y.o. male seen for follow up of Acute on Chronic Respiratory Failure.  Patient right now is on pressure support mode on 28% FiO2 and has a goal of 16 hours  Medications: Reviewed on Rounds  Physical Exam:  Vitals: Temperature 98.1 pulse 87 respiratory rate 16 blood pressure 142/76 saturations 100%  Ventilator Settings mode ventilation pressure support FiO2 28% tidal volume 540 pressure support 12 PEEP 5  . General: Comfortable at this time . Eyes: Grossly normal lids, irises & conjunctiva . ENT: grossly tongue is normal . Neck: no obvious mass . Cardiovascular: S1 S2 normal no gallop . Respiratory: Coarse breath sounds bilaterally . Abdomen: soft . Skin: no rash seen on limited exam . Musculoskeletal: not rigid . Psychiatric:unable to assess . Neurologic: no seizure no involuntary movements         Lab Data:   Basic Metabolic Panel: Recent Labs  Lab 01/02/19 0613 01/03/19 1400 01/05/19 0559  NA 138 141 141  K 2.6* 3.8 3.6  CL 107 108 108  CO2 24 24 23   GLUCOSE 142* 141* 153*  BUN 28* 27* 25*  CREATININE 0.66 0.61 0.60*  CALCIUM 7.5* 7.6* 7.7*  MG  --  2.4 2.4  PHOS 3.4 2.8 2.7    ABG: No results for input(s): PHART, PCO2ART, PO2ART, HCO3, O2SAT in the last 168 hours.  Liver Function Tests: Recent Labs  Lab 01/02/19 0613  AST 32  ALT 87*  ALKPHOS 86  BILITOT 0.7  PROT 4.3*  ALBUMIN 1.7*   No results for input(s): LIPASE, AMYLASE in the last 168 hours. No results for input(s): AMMONIA in the last 168 hours.  CBC: Recent Labs  Lab 01/02/19 0613 01/03/19 1400 01/05/19 0559  WBC 8.5 7.5 6.2  NEUTROABS 7.4  --   --   HGB 9.4* 9.3* 9.4*  HCT 29.3* 28.9* 30.7*  MCV 96.4 93.2  96.2  PLT 84* 70* 124*    Cardiac Enzymes: No results for input(s): CKTOTAL, CKMB, CKMBINDEX, TROPONINI in the last 168 hours.  BNP (last 3 results) Recent Labs    01/02/19 0613  BNP 604.8*    ProBNP (last 3 results) No results for input(s): PROBNP in the last 8760 hours.  Radiological Exams: No results found.  Assessment/Plan Active Problems:   Acute on chronic respiratory failure with hypoxia (HCC)   Bacterial lobar pneumonia   Cervical myelopathy with cervical radiculopathy   Altered mental status   Chronic atrial fibrillation   1. Acute on chronic respiratory failure with hypoxia we will continue with pressure support mode titrate oxygen continue pulmonary toilet. 2. Bacterial pneumonia treated we will continue supportive care 3. Cervical mild colopathy unchanged status post ACDF 4. Altered mental status unchanged 5. Chronic atrial fibrillation rate controlled   I have personally seen and evaluated the patient, evaluated laboratory and imaging results, formulated the assessment and plan and placed orders. The Patient requires high complexity decision making for assessment and support.  Case was discussed on Rounds with the Respiratory Therapy Staff  Allyne Gee, MD [facility] Pulmonary Critical Care Medicine Sleep Medicine

## 2019-01-06 DIAGNOSIS — J9621 Acute and chronic respiratory failure with hypoxia: Secondary | ICD-10-CM | POA: Diagnosis not present

## 2019-01-06 DIAGNOSIS — M4712 Other spondylosis with myelopathy, cervical region: Secondary | ICD-10-CM | POA: Diagnosis not present

## 2019-01-06 DIAGNOSIS — J159 Unspecified bacterial pneumonia: Secondary | ICD-10-CM | POA: Diagnosis not present

## 2019-01-06 DIAGNOSIS — R4182 Altered mental status, unspecified: Secondary | ICD-10-CM | POA: Diagnosis not present

## 2019-01-06 LAB — CULTURE, RESPIRATORY W GRAM STAIN

## 2019-01-06 LAB — CULTURE, RESPIRATORY

## 2019-01-06 NOTE — Progress Notes (Addendum)
Pulmonary Critical Care Medicine [place]   PULMONARY CRITICAL CARE SERVICE  PROGRESS NOTE  Date of Service: 01/06/2019  Roger Nelson  [id number]  DOB: 1941-01-20   DOA: 01/12/2019  Referring Physician: Merton Border, MD  HPI: Roger Nelson is a 78 y.o. male seen for follow up of Acute on Chronic Respiratory Failure.  Today was the patient's first attempt at trach collar which he was able to tolerate for 4 hours today.  He was then switched to pressure support and soon after became apneic.  Patient was placed back on the ventilator to rest as time.  Medications: Reviewed on Rounds  Physical Exam:  Vitals: Pulse 96 respirations 18 BP 164/82 O2 sat 96% temp 97.2  Ventilator Settings ventilator mode AC PC rate of 12 IP 14 PEEP of 5 FiO2 28%  . General: Comfortable at this time . Eyes: Grossly normal lids, irises & conjunctiva . ENT: grossly tongue is normal . Neck: no obvious mass . Cardiovascular: S1 S2 normal no gallop . Respiratory: Coarse breath . Abdomen: soft . Skin: no rash seen on limited exam . Musculoskeletal: not rigid . Psychiatric:unable to assess . Neurologic: no seizure no involuntary movements         Lab Data:   Basic Metabolic Panel: Recent Labs  Lab 01/02/19 0613 01/03/19 1400 01/05/19 0559  NA 138 141 141  K 2.6* 3.8 3.6  CL 107 108 108  CO2 24 24 23   GLUCOSE 142* 141* 153*  BUN 28* 27* 25*  CREATININE 0.66 0.61 0.60*  CALCIUM 7.5* 7.6* 7.7*  MG  --  2.4 2.4  PHOS 3.4 2.8 2.7    ABG: No results for input(s): PHART, PCO2ART, PO2ART, HCO3, O2SAT in the last 168 hours.  Liver Function Tests: Recent Labs  Lab 01/02/19 0613  AST 32  ALT 87*  ALKPHOS 86  BILITOT 0.7  PROT 4.3*  ALBUMIN 1.7*   No results for input(s): LIPASE, AMYLASE in the last 168 hours. No results for input(s): AMMONIA in the last 168 hours.  CBC: Recent Labs  Lab 01/02/19 0613 01/03/19 1400 01/05/19 0559  WBC 8.5 7.5 6.2  NEUTROABS 7.4   --   --   HGB 9.4* 9.3* 9.4*  HCT 29.3* 28.9* 30.7*  MCV 96.4 93.2 96.2  PLT 84* 70* 124*    Cardiac Enzymes: No results for input(s): CKTOTAL, CKMB, CKMBINDEX, TROPONINI in the last 168 hours.  BNP (last 3 results) Recent Labs    01/02/19 0613  BNP 604.8*    ProBNP (last 3 results) No results for input(s): PROBNP in the last 8760 hours.  Radiological Exams: No results found.  Assessment/Plan Active Problems:   Acute on chronic respiratory failure with hypoxia (HCC)   Bacterial lobar pneumonia   Cervical myelopathy with cervical radiculopathy   Altered mental status   Chronic atrial fibrillation   1. Acute chronic respiratory failure with hypoxia continue to attempt trach collar trials as well as pressure support mode and titrate oxygen as tolerated.  Continue aggressive pulmonary toilet and secretion management. 2. Back to ammonia treated continue supportive care 3. Cervical myelopathy status post ACDF 4. Altered mental status unchanged 5. Chronic relation rate controlled   I have personally seen and evaluated the patient, evaluated laboratory and imaging results, formulated the assessment and plan and placed orders. The Patient requires high complexity decision making for assessment and support.  Case was discussed on Rounds with the Respiratory Therapy Staff  Allyne Gee, MD  [facility] Pulmonary Critical Care Medicine Sleep Medicine

## 2019-01-07 DIAGNOSIS — J9621 Acute and chronic respiratory failure with hypoxia: Secondary | ICD-10-CM | POA: Diagnosis not present

## 2019-01-07 DIAGNOSIS — R4182 Altered mental status, unspecified: Secondary | ICD-10-CM | POA: Diagnosis not present

## 2019-01-07 DIAGNOSIS — M4712 Other spondylosis with myelopathy, cervical region: Secondary | ICD-10-CM | POA: Diagnosis not present

## 2019-01-07 DIAGNOSIS — J159 Unspecified bacterial pneumonia: Secondary | ICD-10-CM | POA: Diagnosis not present

## 2019-01-07 NOTE — Progress Notes (Addendum)
Pulmonary Critical Care Medicine [place]   PULMONARY CRITICAL CARE SERVICE  PROGRESS NOTE  Date of Service: 01/07/2019  BOSTON COOKSON  [id number]  DOB: December 18, 1940   DOA: 12/23/2018  Referring Physician: Merton Border, MD  HPI: Roger Nelson is a 78 y.o. male seen for follow up of Acute on Chronic Respiratory Failure.  Patient was able to tolerate 4 hours on T collar yesterday and has a goal today of 8 hours.  So far patient is doing well on 28% FiO2.  Medications: Reviewed on Rounds  Physical Exam:  Vitals: Pulse 101 respiration 17 BP 160/80 O2 sat 99% temp 99  Ventilator Settings not currently on ventilator  . General: Comfortable at this time . Eyes: Grossly normal lids, irises & conjunctiva . ENT: grossly tongue is normal . Neck: no obvious mass . Cardiovascular: S1 S2 normal no gallop . Respiratory: Coarse breath sounds . Abdomen: soft . Skin: no rash seen on limited exam . Musculoskeletal: not rigid . Psychiatric:unable to assess . Neurologic: no seizure no involuntary movements         Lab Data:   Basic Metabolic Panel: Recent Labs  Lab 01/02/19 0613 01/03/19 1400 01/05/19 0559  NA 138 141 141  K 2.6* 3.8 3.6  CL 107 108 108  CO2 24 24 23   GLUCOSE 142* 141* 153*  BUN 28* 27* 25*  CREATININE 0.66 0.61 0.60*  CALCIUM 7.5* 7.6* 7.7*  MG  --  2.4 2.4  PHOS 3.4 2.8 2.7    ABG: No results for input(s): PHART, PCO2ART, PO2ART, HCO3, O2SAT in the last 168 hours.  Liver Function Tests: Recent Labs  Lab 01/02/19 0613  AST 32  ALT 87*  ALKPHOS 86  BILITOT 0.7  PROT 4.3*  ALBUMIN 1.7*   No results for input(s): LIPASE, AMYLASE in the last 168 hours. No results for input(s): AMMONIA in the last 168 hours.  CBC: Recent Labs  Lab 01/02/19 0613 01/03/19 1400 01/05/19 0559  WBC 8.5 7.5 6.2  NEUTROABS 7.4  --   --   HGB 9.4* 9.3* 9.4*  HCT 29.3* 28.9* 30.7*  MCV 96.4 93.2 96.2  PLT 84* 70* 124*    Cardiac Enzymes: No  results for input(s): CKTOTAL, CKMB, CKMBINDEX, TROPONINI in the last 168 hours.  BNP (last 3 results) Recent Labs    01/02/19 0613  BNP 604.8*    ProBNP (last 3 results) No results for input(s): PROBNP in the last 8760 hours.  Radiological Exams: No results found.  Assessment/Plan Active Problems:   Acute on chronic respiratory failure with hypoxia (HCC)   Bacterial lobar pneumonia   Cervical myelopathy with cervical radiculopathy   Altered mental status   Chronic atrial fibrillation   1. Acute on chronic respiratory failure with hypoxia continue trach collar trials per protocol.  As well as pressure support mode as patient can tolerate.  Continue to titrate oxygen.  Continue aggressive control and secretion management at this time. 2. Bacterial pneumonia treated continue point-of-care 3. Cervical myelopathy status post ACDF 4. Altered mental status unchanged 5. Chronic atrial fibrillation rate controlled   I have personally seen and evaluated the patient, evaluated laboratory and imaging results, formulated the assessment and plan and placed orders. The Patient requires high complexity decision making for assessment and support.  Case was discussed on Rounds with the Respiratory Therapy Staff  Allyne Gee, MD [facility] Pulmonary Critical Care Medicine Sleep Medicine

## 2019-01-08 DIAGNOSIS — J9621 Acute and chronic respiratory failure with hypoxia: Secondary | ICD-10-CM | POA: Diagnosis not present

## 2019-01-08 DIAGNOSIS — J159 Unspecified bacterial pneumonia: Secondary | ICD-10-CM | POA: Diagnosis not present

## 2019-01-08 DIAGNOSIS — M4712 Other spondylosis with myelopathy, cervical region: Secondary | ICD-10-CM | POA: Diagnosis not present

## 2019-01-08 DIAGNOSIS — R4182 Altered mental status, unspecified: Secondary | ICD-10-CM | POA: Diagnosis not present

## 2019-01-08 NOTE — Progress Notes (Addendum)
Pulmonary Critical Care Medicine [place]   PULMONARY CRITICAL CARE SERVICE  PROGRESS NOTE  Date of Service: 01/08/2019  Roger Nelson  [id number]  DOB: Aug 22, 1941   DOA: 12/21/2018  Referring Physician: Merton Border, MD  HPI: Roger Nelson is a 78 y.o. male seen for follow up of Acute on Chronic Respiratory Failure.  Patient did well with 8 hours of trach collar yesterday and today has a goal of 12 hours on ATC with 28% FiO2.  Has minimal secretions and no acute distress noted at this time.  Medications: Reviewed on Rounds  Physical Exam:  Vitals: Pulse 107 respirations 15 BP 148/60 O2 sat 100% temp 97.4  Ventilator Settings not currently on ventilator  . General: Comfortable at this time . Eyes: Grossly normal lids, irises & conjunctiva . ENT: grossly tongue is normal . Neck: no obvious mass . Cardiovascular: S1 S2 normal no gallop . Respiratory: Coarse breath sounds . Abdomen: soft . Skin: no rash seen on limited exam . Musculoskeletal: not rigid . Psychiatric:unable to assess . Neurologic: no seizure no involuntary movements         Lab Data:   Basic Metabolic Panel: Recent Labs  Lab 01/02/19 0613 01/03/19 1400 01/05/19 0559  NA 138 141 141  K 2.6* 3.8 3.6  CL 107 108 108  CO2 24 24 23   GLUCOSE 142* 141* 153*  BUN 28* 27* 25*  CREATININE 0.66 0.61 0.60*  CALCIUM 7.5* 7.6* 7.7*  MG  --  2.4 2.4  PHOS 3.4 2.8 2.7    ABG: No results for input(s): PHART, PCO2ART, PO2ART, HCO3, O2SAT in the last 168 hours.  Liver Function Tests: Recent Labs  Lab 01/02/19 0613  AST 32  ALT 87*  ALKPHOS 86  BILITOT 0.7  PROT 4.3*  ALBUMIN 1.7*   No results for input(s): LIPASE, AMYLASE in the last 168 hours. No results for input(s): AMMONIA in the last 168 hours.  CBC: Recent Labs  Lab 01/02/19 0613 01/03/19 1400 01/05/19 0559  WBC 8.5 7.5 6.2  NEUTROABS 7.4  --   --   HGB 9.4* 9.3* 9.4*  HCT 29.3* 28.9* 30.7*  MCV 96.4 93.2 96.2   PLT 84* 70* 124*    Cardiac Enzymes: No results for input(s): CKTOTAL, CKMB, CKMBINDEX, TROPONINI in the last 168 hours.  BNP (last 3 results) Recent Labs    01/02/19 0613  BNP 604.8*    ProBNP (last 3 results) No results for input(s): PROBNP in the last 8760 hours.  Radiological Exams: No results found.  Assessment/Plan Active Problems:   Acute on chronic respiratory failure with hypoxia (HCC)   Bacterial lobar pneumonia   Cervical myelopathy with cervical radiculopathy   Altered mental status   Chronic atrial fibrillation   1. Acute on chronic respiratory failure with hypoxia continue trach collar trials per protocol.  Continues pressure support mode as patient can tolerate.  Continue to titrate oxygen aggressive pulmonary toilet with secretion management. 2. Bacterial pneumonia treated continue supportive care 3. Cervical myelopathy status post ACDF 4. Altered mental status unchanged 5. Chronic atrial fibrillation rate controlled   I have personally seen and evaluated the patient, evaluated laboratory and imaging results, formulated the assessment and plan and placed orders. The Patient requires high complexity decision making for assessment and support.  Case was discussed on Rounds with the Respiratory Therapy Staff  Allyne Gee, MD [facility] Pulmonary Critical Care Medicine Sleep Medicine

## 2019-01-09 DIAGNOSIS — R4182 Altered mental status, unspecified: Secondary | ICD-10-CM | POA: Diagnosis not present

## 2019-01-09 DIAGNOSIS — J159 Unspecified bacterial pneumonia: Secondary | ICD-10-CM | POA: Diagnosis not present

## 2019-01-09 DIAGNOSIS — M4712 Other spondylosis with myelopathy, cervical region: Secondary | ICD-10-CM | POA: Diagnosis not present

## 2019-01-09 DIAGNOSIS — J9621 Acute and chronic respiratory failure with hypoxia: Secondary | ICD-10-CM | POA: Diagnosis not present

## 2019-01-09 LAB — RENAL FUNCTION PANEL
Albumin: 1.9 g/dL — ABNORMAL LOW (ref 3.5–5.0)
Anion gap: 6 (ref 5–15)
BUN: 25 mg/dL — ABNORMAL HIGH (ref 8–23)
CO2: 29 mmol/L (ref 22–32)
Calcium: 7.9 mg/dL — ABNORMAL LOW (ref 8.9–10.3)
Chloride: 108 mmol/L (ref 98–111)
Creatinine, Ser: 0.55 mg/dL — ABNORMAL LOW (ref 0.61–1.24)
GFR calc Af Amer: >60 mL/min (ref >60)
Glucose, Bld: 152 mg/dL — ABNORMAL HIGH (ref 70–99)
PHOSPHORUS: 2.8 mg/dL (ref 2.5–4.6)
Potassium: 3.3 mmol/L — ABNORMAL LOW (ref 3.5–5.1)
Sodium: 143 mmol/L (ref 135–145)

## 2019-01-09 LAB — CBC
HCT: 32.9 % — ABNORMAL LOW (ref 39.0–52.0)
Hemoglobin: 10.5 g/dL — ABNORMAL LOW (ref 13.0–17.0)
MCH: 31.2 pg (ref 26.0–34.0)
MCHC: 31.9 g/dL (ref 30.0–36.0)
MCV: 97.6 fL (ref 80.0–100.0)
Platelets: 222 10*3/uL (ref 150–400)
RBC: 3.37 MIL/uL — ABNORMAL LOW (ref 4.22–5.81)
RDW: 17 % — ABNORMAL HIGH (ref 11.5–15.5)
WBC: 5.6 10*3/uL (ref 4.0–10.5)
nRBC: 0.4 % — ABNORMAL HIGH (ref 0.0–0.2)

## 2019-01-09 NOTE — Progress Notes (Addendum)
Pulmonary Critical Care Medicine [place]   PULMONARY CRITICAL CARE SERVICE  PROGRESS NOTE  Date of Service: 01/09/2019  AVREY HYSER  TIR:[id number]  DOB: [birth year]   DOA: 12/24/2018  Referring Physician: Merton Border, MD  HPI: Roger Nelson is a 78 y.o. male seen for follow up of Acute on Chronic Respiratory Failure.  Patient continues to do well on trach collar 2% FiO2.  Minimal secretions noted at this time.  Medications: Reviewed on Rounds  Physical Exam:  Vitals: Pulse 81 respirations BP 129/68 O2 sat 100% temp 97.9  Ventilator Settings not currently on ventilator  . General: Comfortable at this time . Eyes: Grossly normal lids, irises & conjunctiva . ENT: grossly tongue is normal . Neck: no obvious mass . Cardiovascular: S1 S2 normal no gallop . Respiratory: Coarse breath sounds . Abdomen: soft . Skin: no rash seen on limited exam . Musculoskeletal: not rigid . Psychiatric:unable to assess . Neurologic: no seizure no involuntary movements         Lab Data:   Basic Metabolic Panel: Recent Labs  Lab 01/03/19 1400 01/05/19 0559 01/09/19 0348  NA 141 141 143  K 3.8 3.6 3.3*  CL 108 108 108  CO2 24 23 29   GLUCOSE 141* 153* 152*  BUN 27* 25* 25*  CREATININE 0.61 0.60* 0.55*  CALCIUM 7.6* 7.7* 7.9*  MG 2.4 2.4  --   PHOS 2.8 2.7 2.8    ABG: No results for input(s): PHART, PCO2ART, PO2ART, HCO3, O2SAT in the last 168 hours.  Liver Function Tests: Recent Labs  Lab 01/09/19 0348  ALBUMIN 1.9*   No results for input(s): LIPASE, AMYLASE in the last 168 hours. No results for input(s): AMMONIA in the last 168 hours.  CBC: Recent Labs  Lab 01/03/19 1400 01/05/19 0559 01/09/19 0348  WBC 7.5 6.2 5.6  HGB 9.3* 9.4* 10.5*  HCT 28.9* 30.7* 32.9*  MCV 93.2 96.2 97.6  PLT 70* 124* 222    Cardiac Enzymes: No results for input(s): CKTOTAL, CKMB, CKMBINDEX, TROPONINI in the last 168 hours.  BNP (last 3 results) Recent Labs     01/02/19 0613  BNP 604.8*    ProBNP (last 3 results) No results for input(s): PROBNP in the last 8760 hours.  Radiological Exams: No results found.  Assessment/Plan Active Problems:   Acute on chronic respiratory failure with hypoxia (HCC)   Bacterial lobar pneumonia   Cervical myelopathy with cervical radiculopathy   Altered mental status   Chronic atrial fibrillation   1. Acute on chronic respiratory failure with hypoxia continue trach collar trials per protocol.  Continue to titrate oxygen as tolerated.  Continue aggressive pulmonary toilet with secretion management 2. Bacterial pneumonia treated continue supportive care 3. Cervical myelopathy status post ACDF 4. Altered mental status unchanged 5. Chronic atrial fibrillation rate controlled   I have personally seen and evaluated the patient, evaluated laboratory and imaging results, formulated the assessment and plan and placed orders. The Patient requires high complexity decision making for assessment and support.  Case was discussed on Rounds with the Respiratory Therapy Staff  Allyne Gee, MD [facility] Pulmonary Critical Care Medicine Sleep Medicine

## 2019-01-10 DIAGNOSIS — J9621 Acute and chronic respiratory failure with hypoxia: Secondary | ICD-10-CM | POA: Diagnosis not present

## 2019-01-10 DIAGNOSIS — R4182 Altered mental status, unspecified: Secondary | ICD-10-CM | POA: Diagnosis not present

## 2019-01-10 DIAGNOSIS — M4712 Other spondylosis with myelopathy, cervical region: Secondary | ICD-10-CM | POA: Diagnosis not present

## 2019-01-10 DIAGNOSIS — J159 Unspecified bacterial pneumonia: Secondary | ICD-10-CM | POA: Diagnosis not present

## 2019-01-10 LAB — POTASSIUM: Potassium: 4.1 mmol/L (ref 3.5–5.1)

## 2019-01-10 NOTE — Progress Notes (Addendum)
Pulmonary Critical Care Medicine [place]   PULMONARY CRITICAL CARE SERVICE  PROGRESS NOTE  Date of Service: 01/10/2019  Roger Nelson  NOM:[id number]  DOB: Jun 10, 1941   DOA: 12/26/2018  Referring Physician: Merton Border, MD  HPI: Roger Nelson is a 78 y.o. male seen for follow up of Acute on Chronic Respiratory Failure.  Patient remains on trach collar 28% FiO2.  Has minimal secretions at this time and is afebrile.  Medications: Reviewed on Rounds  Physical Exam:  Vitals: Pulse 82 respirations 21 BP 136/61 O2 sat 98% temp 97.6  Ventilator Settings not currently on ventilator  . General: Comfortable at this time . Eyes: Grossly normal lids, irises & conjunctiva . ENT: grossly tongue is normal . Neck: no obvious mass . Cardiovascular: S1 S2 normal no gallop . Respiratory: Coarse breath sounds . Abdomen: soft . Skin: no rash seen on limited exam . Musculoskeletal: not rigid . Psychiatric:unable to assess . Neurologic: no seizure no involuntary movements         Lab Data:   Basic Metabolic Panel: Recent Labs  Lab 01/03/19 1400 01/05/19 0559 01/09/19 0348 01/10/19 0401  NA 141 141 143  --   K 3.8 3.6 3.3* 4.1  CL 108 108 108  --   CO2 24 23 29   --   GLUCOSE 141* 153* 152*  --   BUN 27* 25* 25*  --   CREATININE 0.61 0.60* 0.55*  --   CALCIUM 7.6* 7.7* 7.9*  --   MG 2.4 2.4  --   --   PHOS 2.8 2.7 2.8  --     ABG: No results for input(s): PHART, PCO2ART, PO2ART, HCO3, O2SAT in the last 168 hours.  Liver Function Tests: Recent Labs  Lab 01/09/19 0348  ALBUMIN 1.9*   No results for input(s): LIPASE, AMYLASE in the last 168 hours. No results for input(s): AMMONIA in the last 168 hours.  CBC: Recent Labs  Lab 01/03/19 1400 01/05/19 0559 01/09/19 0348  WBC 7.5 6.2 5.6  HGB 9.3* 9.4* 10.5*  HCT 28.9* 30.7* 32.9*  MCV 93.2 96.2 97.6  PLT 70* 124* 222    Cardiac Enzymes: No results for input(s): CKTOTAL, CKMB, CKMBINDEX, TROPONINI  in the last 168 hours.  BNP (last 3 results) Recent Labs    01/02/19 0613  BNP 604.8*    ProBNP (last 3 results) No results for input(s): PROBNP in the last 8760 hours.  Radiological Exams: No results found.  Assessment/Plan Active Problems:   Acute on chronic respiratory failure with hypoxia (HCC)   Bacterial lobar pneumonia   Cervical myelopathy with cervical radiculopathy   Altered mental status   Chronic atrial fibrillation   1. Acute on chronic respiratory failure with hypoxia continue trach collar trials per protocol.  Continue to titrate oxygen as patient can tolerate.  Continue pulmonary toilet and secretion management. 2. Bacterial pneumonia treated continue supportive care 3. Cervical myelopathy status post ACDF 4. Altered mental status unchanged 5. Chronic atrial fibrillation rate controlled   I have personally seen and evaluated the patient, evaluated laboratory and imaging results, formulated the assessment and plan and placed orders. The Patient requires high complexity decision making for assessment and support.  Case was discussed on Rounds with the Respiratory Therapy Staff  Allyne Gee, MD [facility] Pulmonary Critical Care Medicine Sleep Medicine

## 2019-01-11 DIAGNOSIS — J159 Unspecified bacterial pneumonia: Secondary | ICD-10-CM | POA: Diagnosis not present

## 2019-01-11 DIAGNOSIS — M4712 Other spondylosis with myelopathy, cervical region: Secondary | ICD-10-CM | POA: Diagnosis not present

## 2019-01-11 DIAGNOSIS — J9621 Acute and chronic respiratory failure with hypoxia: Secondary | ICD-10-CM | POA: Diagnosis not present

## 2019-01-11 DIAGNOSIS — R4182 Altered mental status, unspecified: Secondary | ICD-10-CM | POA: Diagnosis not present

## 2019-01-11 NOTE — Progress Notes (Addendum)
Pulmonary Critical Care Medicine [place]   PULMONARY CRITICAL CARE SERVICE  PROGRESS NOTE  Date of Service: 01/11/2019  Roger Nelson  ZHY:[id number]  DOB: 1941/04/24   DOA: 12/28/2018  Referring Physician: Merton Border, MD  HPI: Roger Nelson is a 78 y.o. male seen for follow up of Acute on Chronic Respiratory Failure.  Patient has a goal of 24 hours on ATC 28% FiO2 at this time.  Currently doing well with minimal secretions.  Medications: Reviewed on Rounds  Physical Exam:  Vitals: Pulse 95 respirations 16 BP 149/85 O2 sat 99% temp 97.6  Ventilator Settings patient's not currently on ventilator  . General: Comfortable at this time . Eyes: Grossly normal lids, irises & conjunctiva . ENT: grossly tongue is normal . Neck: no obvious mass . Cardiovascular: S1 S2 normal no gallop . Respiratory: Coarse sounds . Abdomen: soft . Skin: no rash seen on limited exam . Musculoskeletal: not rigid . Psychiatric:unable to assess . Neurologic: no seizure no involuntary movements         Lab Data:   Basic Metabolic Panel: Recent Labs  Lab 01/05/19 0559 01/09/19 0348 01/10/19 0401  NA 141 143  --   K 3.6 3.3* 4.1  CL 108 108  --   CO2 23 29  --   GLUCOSE 153* 152*  --   BUN 25* 25*  --   CREATININE 0.60* 0.55*  --   CALCIUM 7.7* 7.9*  --   MG 2.4  --   --   PHOS 2.7 2.8  --     ABG: No results for input(s): PHART, PCO2ART, PO2ART, HCO3, O2SAT in the last 168 hours.  Liver Function Tests: Recent Labs  Lab 01/09/19 0348  ALBUMIN 1.9*   No results for input(s): LIPASE, AMYLASE in the last 168 hours. No results for input(s): AMMONIA in the last 168 hours.  CBC: Recent Labs  Lab 01/05/19 0559 01/09/19 0348  WBC 6.2 5.6  HGB 9.4* 10.5*  HCT 30.7* 32.9*  MCV 96.2 97.6  PLT 124* 222    Cardiac Enzymes: No results for input(s): CKTOTAL, CKMB, CKMBINDEX, TROPONINI in the last 168 hours.  BNP (last 3 results) Recent Labs    01/02/19 0613   BNP 604.8*    ProBNP (last 3 results) No results for input(s): PROBNP in the last 8760 hours.  Radiological Exams: No results found.  Assessment/Plan Active Problems:   Acute on chronic respiratory failure with hypoxia (HCC)   Bacterial lobar pneumonia   Cervical myelopathy with cervical radiculopathy   Altered mental status   Chronic atrial fibrillation   1. Acute on chronic respiratory failure with hypoxia continue T collar trial per protocol and titrate oxygen as tolerated.  Continue aggressive pulmonary toilet and secretion management 2. Bacterial pneumonia treated continue supportive care 3. Cervical myelopathy status post ACDF 4. Altered mental status unchanged 5. Chronic atrial fibrillation rate controlled   I have personally seen and evaluated the patient, evaluated laboratory and imaging results, formulated the assessment and plan and placed orders. The Patient requires high complexity decision making for assessment and support.  Case was discussed on Rounds with the Respiratory Therapy Staff  Allyne Gee, MD [facility] Pulmonary Critical Care Medicine Sleep Medicine

## 2019-01-12 DIAGNOSIS — R4182 Altered mental status, unspecified: Secondary | ICD-10-CM | POA: Diagnosis not present

## 2019-01-12 DIAGNOSIS — J9621 Acute and chronic respiratory failure with hypoxia: Secondary | ICD-10-CM | POA: Diagnosis not present

## 2019-01-12 DIAGNOSIS — M4712 Other spondylosis with myelopathy, cervical region: Secondary | ICD-10-CM | POA: Diagnosis not present

## 2019-01-12 DIAGNOSIS — J159 Unspecified bacterial pneumonia: Secondary | ICD-10-CM | POA: Diagnosis not present

## 2019-01-12 LAB — BASIC METABOLIC PANEL
Anion gap: 10 (ref 5–15)
BUN: 31 mg/dL — ABNORMAL HIGH (ref 8–23)
CO2: 28 mmol/L (ref 22–32)
Calcium: 8.5 mg/dL — ABNORMAL LOW (ref 8.9–10.3)
Chloride: 106 mmol/L (ref 98–111)
Creatinine, Ser: 0.58 mg/dL — ABNORMAL LOW (ref 0.61–1.24)
GFR calc Af Amer: >60 mL/min (ref >60)
GFR calc non Af Amer: >60 mL/min (ref >60)
GLUCOSE: 163 mg/dL — AB (ref 70–99)
Potassium: 3.1 mmol/L — ABNORMAL LOW (ref 3.5–5.1)
Sodium: 144 mmol/L (ref 135–145)

## 2019-01-12 NOTE — Progress Notes (Addendum)
Pulmonary Critical Care Medicine [place]   PULMONARY CRITICAL CARE SERVICE  PROGRESS NOTE  Date of Service: 01/12/2019  Roger Nelson  ZES:[id number]  DOB: [birth year]   DOA: 12/15/2018  Referring Physician: Merton Border, MD  HPI: Roger Nelson is a 78 y.o. male seen for follow up of Acute on Chronic Respiratory Failure.  Patient remains on ATC 28% FiO2.  Has a current goal of 48 hours on the trach collar.  He does have an increased amount of secretions with a foul odor.  A sputum culture has been ordered and collected.  Awaiting results at this time.  Medications: Reviewed on Rounds  Physical Exam:  Vitals: Pulse 111 respirations 18 BP 116/78 O2 sat 100% temp 99.4  Ventilator Settings not currently on ventilator  . General: Comfortable at this time . Eyes: Grossly normal lids, irises & conjunctiva . ENT: grossly tongue is normal . Neck: no obvious mass . Cardiovascular: S1 S2 normal no gallop . Respiratory: Coarse breath sounds . Abdomen: soft . Skin: no rash seen on limited exam . Musculoskeletal: not rigid . Psychiatric:unable to assess . Neurologic: no seizure no involuntary movements         Lab Data:   Basic Metabolic Panel: Recent Labs  Lab 01/09/19 0348 01/10/19 0401 01/12/19 0407  NA 143  --  144  K 3.3* 4.1 3.1*  CL 108  --  106  CO2 29  --  28  GLUCOSE 152*  --  163*  BUN 25*  --  31*  CREATININE 0.55*  --  0.58*  CALCIUM 7.9*  --  8.5*  PHOS 2.8  --   --     ABG: No results for input(s): PHART, PCO2ART, PO2ART, HCO3, O2SAT in the last 168 hours.  Liver Function Tests: Recent Labs  Lab 01/09/19 0348  ALBUMIN 1.9*   No results for input(s): LIPASE, AMYLASE in the last 168 hours. No results for input(s): AMMONIA in the last 168 hours.  CBC: Recent Labs  Lab 01/09/19 0348  WBC 5.6  HGB 10.5*  HCT 32.9*  MCV 97.6  PLT 222    Cardiac Enzymes: No results for input(s): CKTOTAL, CKMB, CKMBINDEX, TROPONINI in the last  168 hours.  BNP (last 3 results) Recent Labs    01/02/19 0613  BNP 604.8*    ProBNP (last 3 results) No results for input(s): PROBNP in the last 8760 hours.  Radiological Exams: No results found.  Assessment/Plan Active Problems:   Acute on chronic respiratory failure with hypoxia (HCC)   Bacterial lobar pneumonia   Cervical myelopathy with cervical radiculopathy   Altered mental status   Chronic atrial fibrillation   1. Acute on chronic respiratory failure with hypoxia continue T collar trials per protocol and titrate oxygen as tolerated.  Continue aggressive pulmonary toilet and secretion management. 2. Bacterial pneumonia treated continue supportive care 3. Cervical myelopathy status post ACDF 4. Altered mental status unchanged 5. Chronic atrial fibrillation rate controlled   I have personally seen and evaluated the patient, evaluated laboratory and imaging results, formulated the assessment and plan and placed orders. The Patient requires high complexity decision making for assessment and support.  Case was discussed on Rounds with the Respiratory Therapy Staff  Allyne Gee, MD [facility] Pulmonary Critical Care Medicine Sleep Medicine

## 2019-01-13 ENCOUNTER — Other Ambulatory Visit (HOSPITAL_COMMUNITY): Payer: Self-pay

## 2019-01-13 DIAGNOSIS — M4712 Other spondylosis with myelopathy, cervical region: Secondary | ICD-10-CM | POA: Diagnosis not present

## 2019-01-13 DIAGNOSIS — J9621 Acute and chronic respiratory failure with hypoxia: Secondary | ICD-10-CM | POA: Diagnosis not present

## 2019-01-13 DIAGNOSIS — J159 Unspecified bacterial pneumonia: Secondary | ICD-10-CM | POA: Diagnosis not present

## 2019-01-13 DIAGNOSIS — R4182 Altered mental status, unspecified: Secondary | ICD-10-CM | POA: Diagnosis not present

## 2019-01-13 LAB — BASIC METABOLIC PANEL
ANION GAP: 14 (ref 5–15)
BUN: 36 mg/dL — ABNORMAL HIGH (ref 8–23)
CO2: 29 mmol/L (ref 22–32)
Calcium: 8.7 mg/dL — ABNORMAL LOW (ref 8.9–10.3)
Chloride: 100 mmol/L (ref 98–111)
Creatinine, Ser: 0.72 mg/dL (ref 0.61–1.24)
GFR calc Af Amer: >60 mL/min (ref >60)
GLUCOSE: 166 mg/dL — AB (ref 70–99)
Potassium: 2.7 mmol/L — CL (ref 3.5–5.1)
Sodium: 143 mmol/L (ref 135–145)

## 2019-01-13 LAB — CBC
HEMATOCRIT: 34.6 % — AB (ref 39.0–52.0)
Hemoglobin: 10.7 g/dL — ABNORMAL LOW (ref 13.0–17.0)
MCH: 29.8 pg (ref 26.0–34.0)
MCHC: 30.9 g/dL (ref 30.0–36.0)
MCV: 96.4 fL (ref 80.0–100.0)
Platelets: 325 10*3/uL (ref 150–400)
RBC: 3.59 MIL/uL — ABNORMAL LOW (ref 4.22–5.81)
RDW: 16.1 % — ABNORMAL HIGH (ref 11.5–15.5)
WBC: 9.5 10*3/uL (ref 4.0–10.5)
nRBC: 0.4 % — ABNORMAL HIGH (ref 0.0–0.2)

## 2019-01-13 LAB — MAGNESIUM: Magnesium: 2.2 mg/dL (ref 1.7–2.4)

## 2019-01-13 LAB — POTASSIUM: Potassium: 3.5 mmol/L (ref 3.5–5.1)

## 2019-01-13 MED ORDER — GENERIC EXTERNAL MEDICATION
Status: DC
Start: ? — End: 2019-01-13

## 2019-01-13 NOTE — Progress Notes (Addendum)
Pulmonary Critical Care Medicine [place]   PULMONARY CRITICAL CARE SERVICE  PROGRESS NOTE  Date of Service: 01/13/2019  Roger Nelson  DGL:[id number]  DOB: Sep 28, 1941   DOA: 01/06/2019  Referring Physician: Merton Border, MD  HPI: Roger Nelson is a 78 y.o. male seen for follow up of Acute on Chronic Respiratory Failure. Patient continues to be on aerosol trach collar 35% FiO2 at this time.  Using PMV without difficulty.  Medications: Reviewed on Rounds  Physical Exam:  Vitals: Pulse 102 respirations 24 BP 155/74 O2 sat 96% temp 99.0  Ventilator Settings not currently on ventilator  . General: Comfortable at this time . Eyes: Grossly normal lids, irises & conjunctiva . ENT: grossly tongue is normal . Neck: no obvious mass . Cardiovascular: S1 S2 normal no gallop . Respiratory: Coarse breath sounds . Abdomen: soft . Skin: no rash seen on limited exam . Musculoskeletal: not rigid . Psychiatric:unable to assess . Neurologic: no seizure no involuntary movements         Lab Data:   Basic Metabolic Panel: Recent Labs  Lab 01/09/19 0348 01/10/19 0401 01/12/19 0407 01/13/19 0459  NA 143  --  144 143  K 3.3* 4.1 3.1* 2.7*  CL 108  --  106 100  CO2 29  --  28 29  GLUCOSE 152*  --  163* 166*  BUN 25*  --  31* 36*  CREATININE 0.55*  --  0.58* 0.72  CALCIUM 7.9*  --  8.5* 8.7*  MG  --   --   --  2.2  PHOS 2.8  --   --   --     ABG: No results for input(s): PHART, PCO2ART, PO2ART, HCO3, O2SAT in the last 168 hours.  Liver Function Tests: Recent Labs  Lab 01/09/19 0348  ALBUMIN 1.9*   No results for input(s): LIPASE, AMYLASE in the last 168 hours. No results for input(s): AMMONIA in the last 168 hours.  CBC: Recent Labs  Lab 01/09/19 0348 01/13/19 0459  WBC 5.6 9.5  HGB 10.5* 10.7*  HCT 32.9* 34.6*  MCV 97.6 96.4  PLT 222 325    Cardiac Enzymes: No results for input(s): CKTOTAL, CKMB, CKMBINDEX, TROPONINI in the last 168  hours.  BNP (last 3 results) Recent Labs    01/02/19 0613  BNP 604.8*    ProBNP (last 3 results) No results for input(s): PROBNP in the last 8760 hours.  Radiological Exams: Dg Chest Port 1 View  Result Date: 01/13/2019 CLINICAL DATA:  Pneumonia EXAM: PORTABLE CHEST 1 VIEW COMPARISON:  None. FINDINGS: Tracheostomy in satisfactory position. Moderate left pleural effusion, increased. Associated left lower lobe opacity, atelectasis versus pneumonia. Basilar opacity at the left lung base, likely atelectasis. No pneumothorax. Left arm PICC terminates at the cavoatrial junction. Cardiomegaly. Radiopaque foreign body overlying the midline, presumably external to the patient. Degenerative changes of the thoracic spine. IMPRESSION: Moderate left pleural effusion, increased. Associated left lower lobe opacity, atelectasis versus pneumonia. Radiopaque foreign body overlying the midline, presumably external to the patient. Electronically Signed   By: Julian Hy M.D.   On: 01/13/2019 08:17    Assessment/Plan Active Problems:   Acute on chronic respiratory failure with hypoxia (HCC)   Bacterial lobar pneumonia   Cervical myelopathy with cervical radiculopathy   Altered mental status   Chronic atrial fibrillation   1. Acute on chronic respiratory failure with hypoxia continue T collar trials per protocol and titrate oxygen as tolerated.  Continue aggressive pulmonary  toilet and secretion management 2. Bacterial pneumonia treated continue supportive care 3. Cervical myelopathy status post ACDF 4. Altered mental status unchanged 5. Chronic atrial fibrillation rate controlled   I have personally seen and evaluated the patient, evaluated laboratory and imaging results, formulated the assessment and plan and placed orders. The Patient requires high complexity decision making for assessment and support.  Case was discussed on Rounds with the Respiratory Therapy Staff  Allyne Gee, MD  [facility] Pulmonary Critical Care Medicine Sleep Medicine

## 2019-01-14 ENCOUNTER — Other Ambulatory Visit (HOSPITAL_COMMUNITY): Payer: Self-pay

## 2019-01-14 DIAGNOSIS — M4712 Other spondylosis with myelopathy, cervical region: Secondary | ICD-10-CM | POA: Diagnosis not present

## 2019-01-14 DIAGNOSIS — J159 Unspecified bacterial pneumonia: Secondary | ICD-10-CM | POA: Diagnosis not present

## 2019-01-14 DIAGNOSIS — R4182 Altered mental status, unspecified: Secondary | ICD-10-CM | POA: Diagnosis not present

## 2019-01-14 DIAGNOSIS — J9621 Acute and chronic respiratory failure with hypoxia: Secondary | ICD-10-CM | POA: Diagnosis not present

## 2019-01-14 LAB — CBC
HCT: 36.1 % — ABNORMAL LOW (ref 39.0–52.0)
Hemoglobin: 11.4 g/dL — ABNORMAL LOW (ref 13.0–17.0)
MCH: 31.1 pg (ref 26.0–34.0)
MCHC: 31.6 g/dL (ref 30.0–36.0)
MCV: 98.4 fL (ref 80.0–100.0)
NRBC: 0.4 % — AB (ref 0.0–0.2)
Platelets: 300 10*3/uL (ref 150–400)
RBC: 3.67 MIL/uL — AB (ref 4.22–5.81)
RDW: 16.3 % — ABNORMAL HIGH (ref 11.5–15.5)
WBC: 11.2 10*3/uL — ABNORMAL HIGH (ref 4.0–10.5)

## 2019-01-14 LAB — BASIC METABOLIC PANEL
Anion gap: 13 (ref 5–15)
BUN: 44 mg/dL — ABNORMAL HIGH (ref 8–23)
CO2: 32 mmol/L (ref 22–32)
Calcium: 9.6 mg/dL (ref 8.9–10.3)
Chloride: 101 mmol/L (ref 98–111)
Creatinine, Ser: 0.76 mg/dL (ref 0.61–1.24)
GFR calc non Af Amer: >60 mL/min (ref >60)
Glucose, Bld: 169 mg/dL — ABNORMAL HIGH (ref 70–99)
Potassium: 3.3 mmol/L — ABNORMAL LOW (ref 3.5–5.1)
Sodium: 146 mmol/L — ABNORMAL HIGH (ref 135–145)

## 2019-01-14 LAB — PHOSPHORUS: PHOSPHORUS: 4 mg/dL (ref 2.5–4.6)

## 2019-01-14 LAB — MAGNESIUM: MAGNESIUM: 2.3 mg/dL (ref 1.7–2.4)

## 2019-01-14 MED ORDER — GENERIC EXTERNAL MEDICATION
Status: DC
Start: ? — End: 2019-01-14

## 2019-01-14 NOTE — Progress Notes (Addendum)
Pulmonary Critical Care Medicine [place]   PULMONARY CRITICAL CARE SERVICE  PROGRESS NOTE  Date of Service: 01/14/2019  IDUS RATHKE  GLO:[id number]  DOB: 19-Dec-1940   DOA: 01/08/2019  Referring Physician: Merton Border, MD  HPI: Roger Nelson is a 78 y.o. male seen for follow up of Acute on Chronic Respiratory Failure.  Patient remains on aerosol trach collar 30% FiO2.  He is able to wear his PMV for 6 to 8 hours without difficulty.  He did have increased secretions today so RT is not using his speaking valve at this time.  Medications: Reviewed on Rounds  Physical Exam:  Vitals: Pulse 113 respirations 20 BP 115/70 O2 sat 96% temp 97 point  Ventilator Settings not only on ventilator  . General: Comfortable at this time . Eyes: Grossly normal lids, irises & conjunctiva . ENT: grossly tongue is normal . Neck: no obvious mass . Cardiovascular: S1 S2 normal no gallop . Respiratory: Coarse breath sounds . Abdomen: soft . Skin: no rash seen on limited exam . Musculoskeletal: not rigid . Psychiatric:unable to assess . Neurologic: no seizure no involuntary movements         Lab Data:   Basic Metabolic Panel: Recent Labs  Lab 01/09/19 0348 01/10/19 0401 01/12/19 0407 01/13/19 0459 01/13/19 1702 01/14/19 0512  NA 143  --  144 143  --  146*  K 3.3* 4.1 3.1* 2.7* 3.5 3.3*  CL 108  --  106 100  --  101  CO2 29  --  28 29  --  32  GLUCOSE 152*  --  163* 166*  --  169*  BUN 25*  --  31* 36*  --  44*  CREATININE 0.55*  --  0.58* 0.72  --  0.76  CALCIUM 7.9*  --  8.5* 8.7*  --  9.6  MG  --   --   --  2.2  --  2.3  PHOS 2.8  --   --   --   --  4.0    ABG: No results for input(s): PHART, PCO2ART, PO2ART, HCO3, O2SAT in the last 168 hours.  Liver Function Tests: Recent Labs  Lab 01/09/19 0348  ALBUMIN 1.9*   No results for input(s): LIPASE, AMYLASE in the last 168 hours. No results for input(s): AMMONIA in the last 168 hours.  CBC: Recent Labs   Lab 01/09/19 0348 01/13/19 0459 01/14/19 0512  WBC 5.6 9.5 11.2*  HGB 10.5* 10.7* 11.4*  HCT 32.9* 34.6* 36.1*  MCV 97.6 96.4 98.4  PLT 222 325 300    Cardiac Enzymes: No results for input(s): CKTOTAL, CKMB, CKMBINDEX, TROPONINI in the last 168 hours.  BNP (last 3 results) Recent Labs    01/02/19 0613  BNP 604.8*    ProBNP (last 3 results) No results for input(s): PROBNP in the last 8760 hours.  Radiological Exams: Dg Chest Port 1 View  Result Date: 01/14/2019 CLINICAL DATA:  Pleural effusion, history atrial fibrillation, respiratory failure, coronary artery disease, diabetes mellitus, hypertension, former smoker EXAM: PORTABLE CHEST 1 VIEW COMPARISON:  Portable exam 0740 hours compared to 01/13/2019 FINDINGS: Tip of endotracheal tube projects approximately 3.8 cm above carina. Enlargement of cardiac silhouette. Interval removal of LEFT arm PICC line. Persistent LEFT pleural effusion with atelectasis versus consolidation of LEFT lower lobe. Mild RIGHT basilar atelectasis versus infiltrate. No pneumothorax or acute osseous findings. IMPRESSION: Enlargement of cardiac silhouette with persistent bibasilar atelectasis versus consolidation, much greater on LEFT, not significantly  changed. Electronically Signed   By: Lavonia Dana M.D.   On: 01/14/2019 08:24   Dg Chest Port 1 View  Result Date: 01/13/2019 CLINICAL DATA:  Pneumonia EXAM: PORTABLE CHEST 1 VIEW COMPARISON:  None. FINDINGS: Tracheostomy in satisfactory position. Moderate left pleural effusion, increased. Associated left lower lobe opacity, atelectasis versus pneumonia. Basilar opacity at the left lung base, likely atelectasis. No pneumothorax. Left arm PICC terminates at the cavoatrial junction. Cardiomegaly. Radiopaque foreign body overlying the midline, presumably external to the patient. Degenerative changes of the thoracic spine. IMPRESSION: Moderate left pleural effusion, increased. Associated left lower lobe opacity,  atelectasis versus pneumonia. Radiopaque foreign body overlying the midline, presumably external to the patient. Electronically Signed   By: Julian Hy M.D.   On: 01/13/2019 08:17    Assessment/Plan Active Problems:   Acute on chronic respiratory failure with hypoxia (HCC)   Bacterial lobar pneumonia   Cervical myelopathy with cervical radiculopathy   Altered mental status   Chronic atrial fibrillation   1. Acute on chronic respiratory failure with hypoxia continue T collar trials and titrate oxygen as tolerated.  Continue pulmonary toilet pressure management 2. Bacterial pneumonia treated continue supportive care 3. Cervical myelopathy status post ACDF 4. Altered mental status unchanged 5. Chronic atrial fibrillation rate controlled   I have personally seen and evaluated the patient, evaluated laboratory and imaging results, formulated the assessment and plan and placed orders. The Patient requires high complexity decision making for assessment and support.  Case was discussed on Rounds with the Respiratory Therapy Staff  Allyne Gee, MD [facility] Pulmonary Critical Care Medicine Sleep Medicine

## 2019-01-15 DIAGNOSIS — J9621 Acute and chronic respiratory failure with hypoxia: Secondary | ICD-10-CM | POA: Diagnosis not present

## 2019-01-15 DIAGNOSIS — R4182 Altered mental status, unspecified: Secondary | ICD-10-CM | POA: Diagnosis not present

## 2019-01-15 DIAGNOSIS — J159 Unspecified bacterial pneumonia: Secondary | ICD-10-CM | POA: Diagnosis not present

## 2019-01-15 DIAGNOSIS — M4712 Other spondylosis with myelopathy, cervical region: Secondary | ICD-10-CM | POA: Diagnosis not present

## 2019-01-15 LAB — BASIC METABOLIC PANEL
Anion gap: 6 (ref 5–15)
BUN: 46 mg/dL — AB (ref 8–23)
CO2: 34 mmol/L — ABNORMAL HIGH (ref 22–32)
Calcium: 7.5 mg/dL — ABNORMAL LOW (ref 8.9–10.3)
Chloride: 102 mmol/L (ref 98–111)
Creatinine, Ser: 0.86 mg/dL (ref 0.61–1.24)
GFR calc Af Amer: >60 mL/min (ref >60)
Glucose, Bld: 143 mg/dL — ABNORMAL HIGH (ref 70–99)
POTASSIUM: 4 mmol/L (ref 3.5–5.1)
Sodium: 142 mmol/L (ref 135–145)

## 2019-01-15 LAB — CULTURE, RESPIRATORY W GRAM STAIN

## 2019-01-15 NOTE — Progress Notes (Addendum)
Pulmonary Critical Care Medicine [place]   PULMONARY CRITICAL CARE SERVICE  PROGRESS NOTE  Date of Service: 01/15/2019  Roger Nelson  QQI:[id number]  DOB: 01/14/1941   DOA: 01/14/2019  Referring Physician: Merton Border, MD  HPI: Roger Nelson is a 78 y.o. male seen for follow up of Acute on Chronic Respiratory Failure.  Patient continues to do well on aerosol trach collar 28% FiO2.  Using PMV today however does not speak very much.  No acute distress is noted.  Patient remains afebrile.  Medications: Reviewed on Rounds  Physical Exam:  Vitals: Pulse 102 respiration 18 BP 131/67 O2 sat 93% temp 97.8  Ventilator Settings not currently on ventilator  . General: Comfortable at this time . Eyes: Grossly normal lids, irises & conjunctiva . ENT: grossly tongue is normal . Neck: no obvious mass . Cardiovascular: S1 S2 normal no gallop . Respiratory: course Breath sounds . Abdomen: soft . Skin: no rash seen on limited exam . Musculoskeletal: not rigid . Psychiatric:unable to assess . Neurologic: no seizure no involuntary movements         Lab Data:   Basic Metabolic Panel: Recent Labs  Lab 01/09/19 0348  01/12/19 0407 01/13/19 0459 01/13/19 1702 01/14/19 0512 01/15/19 0628  NA 143  --  144 143  --  146* 142  K 3.3*   < > 3.1* 2.7* 3.5 3.3* 4.0  CL 108  --  106 100  --  101 102  CO2 29  --  28 29  --  32 34*  GLUCOSE 152*  --  163* 166*  --  169* 143*  BUN 25*  --  31* 36*  --  44* 46*  CREATININE 0.55*  --  0.58* 0.72  --  0.76 0.86  CALCIUM 7.9*  --  8.5* 8.7*  --  9.6 7.5*  MG  --   --   --  2.2  --  2.3  --   PHOS 2.8  --   --   --   --  4.0  --    < > = values in this interval not displayed.    ABG: No results for input(s): PHART, PCO2ART, PO2ART, HCO3, O2SAT in the last 168 hours.  Liver Function Tests: Recent Labs  Lab 01/09/19 0348  ALBUMIN 1.9*   No results for input(s): LIPASE, AMYLASE in the last 168 hours. No results for  input(s): AMMONIA in the last 168 hours.  CBC: Recent Labs  Lab 01/09/19 0348 01/13/19 0459 01/14/19 0512  WBC 5.6 9.5 11.2*  HGB 10.5* 10.7* 11.4*  HCT 32.9* 34.6* 36.1*  MCV 97.6 96.4 98.4  PLT 222 325 300    Cardiac Enzymes: No results for input(s): CKTOTAL, CKMB, CKMBINDEX, TROPONINI in the last 168 hours.  BNP (last 3 results) Recent Labs    01/02/19 0613  BNP 604.8*    ProBNP (last 3 results) No results for input(s): PROBNP in the last 8760 hours.  Radiological Exams: Dg Chest Port 1 View  Result Date: 01/14/2019 CLINICAL DATA:  Pleural effusion, history atrial fibrillation, respiratory failure, coronary artery disease, diabetes mellitus, hypertension, former smoker EXAM: PORTABLE CHEST 1 VIEW COMPARISON:  Portable exam 0740 hours compared to 01/13/2019 FINDINGS: Tip of endotracheal tube projects approximately 3.8 cm above carina. Enlargement of cardiac silhouette. Interval removal of LEFT arm PICC line. Persistent LEFT pleural effusion with atelectasis versus consolidation of LEFT lower lobe. Mild RIGHT basilar atelectasis versus infiltrate. No pneumothorax or acute osseous  findings. IMPRESSION: Enlargement of cardiac silhouette with persistent bibasilar atelectasis versus consolidation, much greater on LEFT, not significantly changed. Electronically Signed   By: Lavonia Dana M.D.   On: 01/14/2019 08:24    Assessment/Plan Active Problems:   Acute on chronic respiratory failure with hypoxia (HCC)   Bacterial lobar pneumonia   Cervical myelopathy with cervical radiculopathy   Altered mental status   Chronic atrial fibrillation   1. Acute on chronic respiratory failure with hypoxia continue T collar trials and titrate oxygen if possible.  Continue pulmonary toilet and secretion management at this time. 2. Bacterial pneumonia treated daily supportive care 3. Cervical myelopathy status post ACDF 4. Altered mental status unchanged 5. Chronic atrial fibrillation rate  controlled   I have personally seen and evaluated the patient, evaluated laboratory and imaging results, formulated the assessment and plan and placed orders. The Patient requires high complexity decision making for assessment and support.  Case was discussed on Rounds with the Respiratory Therapy Staff  Allyne Gee, MD [facility] Pulmonary Critical Care Medicine Sleep Medicine

## 2019-01-15 DEATH — deceased

## 2019-01-16 DIAGNOSIS — J159 Unspecified bacterial pneumonia: Secondary | ICD-10-CM | POA: Diagnosis not present

## 2019-01-16 DIAGNOSIS — J9621 Acute and chronic respiratory failure with hypoxia: Secondary | ICD-10-CM | POA: Diagnosis not present

## 2019-01-16 DIAGNOSIS — R4182 Altered mental status, unspecified: Secondary | ICD-10-CM | POA: Diagnosis not present

## 2019-01-16 DIAGNOSIS — M4712 Other spondylosis with myelopathy, cervical region: Secondary | ICD-10-CM | POA: Diagnosis not present

## 2019-01-16 NOTE — Progress Notes (Signed)
Pulmonary Critical Care Medicine [place]   PULMONARY CRITICAL CARE SERVICE  PROGRESS NOTE  Date of Service: 01/16/2019  Roger Nelson  MEQ:[id number]  DOB: 1941/07/09   DOA: 12/15/2018  Referring Physician: Merton Border, MD  HPI: Roger Nelson is a 78 y.o. male seen for follow up of Acute on Chronic Respiratory Failure.  Patient right now is on T collar has been on 35% FiO2 secretions are fair to moderate  Medications: Reviewed on Rounds  Physical Exam:  Vitals: Temperature 97.9 pulse 60 respiratory 14 blood pressure 130/66 saturations 98%  Ventilator Settings on T collar FiO2 35%  . General: Comfortable at this time . Eyes: Grossly normal lids, irises & conjunctiva . ENT: grossly tongue is normal . Neck: no obvious mass . Cardiovascular: S1 S2 normal no gallop . Respiratory: No rhonchi or rales are noted at this time . Abdomen: soft . Skin: no rash seen on limited exam . Musculoskeletal: not rigid . Psychiatric:unable to assess . Neurologic: no seizure no involuntary movements         Lab Data:   Basic Metabolic Panel: Recent Labs  Lab 01/12/19 0407 01/13/19 0459 01/13/19 1702 01/14/19 0512 01/15/19 0628  NA 144 143  --  146* 142  K 3.1* 2.7* 3.5 3.3* 4.0  CL 106 100  --  101 102  CO2 28 29  --  32 34*  GLUCOSE 163* 166*  --  169* 143*  BUN 31* 36*  --  44* 46*  CREATININE 0.58* 0.72  --  0.76 0.86  CALCIUM 8.5* 8.7*  --  9.6 7.5*  MG  --  2.2  --  2.3  --   PHOS  --   --   --  4.0  --     ABG: No results for input(s): PHART, PCO2ART, PO2ART, HCO3, O2SAT in the last 168 hours.  Liver Function Tests: No results for input(s): AST, ALT, ALKPHOS, BILITOT, PROT, ALBUMIN in the last 168 hours. No results for input(s): LIPASE, AMYLASE in the last 168 hours. No results for input(s): AMMONIA in the last 168 hours.  CBC: Recent Labs  Lab 01/13/19 0459 01/14/19 0512  WBC 9.5 11.2*  HGB 10.7* 11.4*  HCT 34.6* 36.1*  MCV 96.4 98.4  PLT  325 300    Cardiac Enzymes: No results for input(s): CKTOTAL, CKMB, CKMBINDEX, TROPONINI in the last 168 hours.  BNP (last 3 results) Recent Labs    01/02/19 0613  BNP 604.8*    ProBNP (last 3 results) No results for input(s): PROBNP in the last 8760 hours.  Radiological Exams: No results found.  Assessment/Plan Active Problems:   Acute on chronic respiratory failure with hypoxia (HCC)   Bacterial lobar pneumonia   Cervical myelopathy with cervical radiculopathy   Altered mental status   Chronic atrial fibrillation   1. Acute on chronic respiratory failure with hypoxia we will continue with T collar continue aggressive pulmonary toilet supportive care.  Assess for potential capping.  Patient is using the PMV 2. Bacterial lobar pneumonia treated we will continue to monitor. 3. Cervical myelopathy unchanged we will continue with supportive care 4. Altered mental status waxing waning 5. Chronic atrial fibrillation rate is controlled at this time   I have personally seen and evaluated the patient, evaluated laboratory and imaging results, formulated the assessment and plan and placed orders. The Patient requires high complexity decision making for assessment and support.  Case was discussed on Rounds with the Respiratory Therapy Staff  Allyne Gee, MD [facility] Pulmonary Critical Care Medicine Sleep Medicine

## 2019-01-17 ENCOUNTER — Other Ambulatory Visit (HOSPITAL_COMMUNITY): Payer: Self-pay

## 2019-01-17 DIAGNOSIS — J9621 Acute and chronic respiratory failure with hypoxia: Secondary | ICD-10-CM | POA: Diagnosis not present

## 2019-01-17 DIAGNOSIS — M4712 Other spondylosis with myelopathy, cervical region: Secondary | ICD-10-CM | POA: Diagnosis not present

## 2019-01-17 DIAGNOSIS — J159 Unspecified bacterial pneumonia: Secondary | ICD-10-CM | POA: Diagnosis not present

## 2019-01-17 DIAGNOSIS — R4182 Altered mental status, unspecified: Secondary | ICD-10-CM | POA: Diagnosis not present

## 2019-01-17 LAB — BASIC METABOLIC PANEL
Anion gap: 14 (ref 5–15)
BUN: 54 mg/dL — ABNORMAL HIGH (ref 8–23)
CO2: 30 mmol/L (ref 22–32)
Calcium: 9.4 mg/dL (ref 8.9–10.3)
Chloride: 89 mmol/L — ABNORMAL LOW (ref 98–111)
Creatinine, Ser: 1.13 mg/dL (ref 0.61–1.24)
GFR calc Af Amer: >60 mL/min (ref >60)
GFR calc non Af Amer: >60 mL/min (ref >60)
Glucose, Bld: 131 mg/dL — ABNORMAL HIGH (ref 70–99)
Potassium: 3.3 mmol/L — ABNORMAL LOW (ref 3.5–5.1)
Sodium: 133 mmol/L — ABNORMAL LOW (ref 135–145)

## 2019-01-17 LAB — CBC
HCT: 32.1 % — ABNORMAL LOW (ref 39.0–52.0)
Hemoglobin: 10.1 g/dL — ABNORMAL LOW (ref 13.0–17.0)
MCH: 29.8 pg (ref 26.0–34.0)
MCHC: 31.5 g/dL (ref 30.0–36.0)
MCV: 94.7 fL (ref 80.0–100.0)
Platelets: 307 10*3/uL (ref 150–400)
RBC: 3.39 MIL/uL — ABNORMAL LOW (ref 4.22–5.81)
RDW: 16.2 % — ABNORMAL HIGH (ref 11.5–15.5)
WBC: 20.1 10*3/uL — ABNORMAL HIGH (ref 4.0–10.5)
nRBC: 0.3 % — ABNORMAL HIGH (ref 0.0–0.2)

## 2019-01-17 LAB — MAGNESIUM: Magnesium: 2.4 mg/dL (ref 1.7–2.4)

## 2019-01-17 LAB — PHOSPHORUS: Phosphorus: 4.1 mg/dL (ref 2.5–4.6)

## 2019-01-17 NOTE — Progress Notes (Addendum)
Pulmonary Critical Care Medicine [place]   PULMONARY CRITICAL CARE SERVICE  PROGRESS NOTE  Date of Service: 01/17/2019  Roger Nelson  SNK:[id number]  DOB: 22-Aug-1941   DOA: 12/24/2018  Referring Physician: Merton Border, MD  HPI: Roger Nelson is a 78 y.o. male seen for follow up of Acute on Chronic Respiratory Failure.  Patient remains on aerosol trach collar 35% FiO2 at this time he is requiring 2 L of oxygen via nasal cannula intermittently with his trach collar due to desaturations.  Right now do well with no acute distress noted.  Medications: Reviewed on Rounds  Physical Exam:  Vitals: Pulse 89 respirations 24 BP 91/69 O2 sat 94% temp 91.6  Ventilator Settings T collar 35% artery  . General: Comfortable at this time . Eyes: Grossly normal lids, irises & conjunctiva . ENT: grossly tongue is normal . Neck: no obvious mass . Cardiovascular: S1 S2 normal no gallop . Respiratory: No rales or rhonchi noted . Abdomen: soft . Skin: no rash seen on limited exam . Musculoskeletal: not rigid . Psychiatric:unable to assess . Neurologic: no seizure no involuntary movements         Lab Data:   Basic Metabolic Panel: Recent Labs  Lab 01/12/19 0407 01/13/19 0459 01/13/19 1702 01/14/19 0512 01/15/19 0628 01/17/19 0444  NA 144 143  --  146* 142 133*  K 3.1* 2.7* 3.5 3.3* 4.0 3.3*  CL 106 100  --  101 102 89*  CO2 28 29  --  32 34* 30  GLUCOSE 163* 166*  --  169* 143* 131*  BUN 31* 36*  --  44* 46* 54*  CREATININE 0.58* 0.72  --  0.76 0.86 1.13  CALCIUM 8.5* 8.7*  --  9.6 7.5* 9.4  MG  --  2.2  --  2.3  --  2.4  PHOS  --   --   --  4.0  --  4.1    ABG: No results for input(s): PHART, PCO2ART, PO2ART, HCO3, O2SAT in the last 168 hours.  Liver Function Tests: No results for input(s): AST, ALT, ALKPHOS, BILITOT, PROT, ALBUMIN in the last 168 hours. No results for input(s): LIPASE, AMYLASE in the last 168 hours. No results for input(s): AMMONIA in the  last 168 hours.  CBC: Recent Labs  Lab 01/13/19 0459 01/14/19 0512 01/17/19 0444  WBC 9.5 11.2* 20.1*  HGB 10.7* 11.4* 10.1*  HCT 34.6* 36.1* 32.1*  MCV 96.4 98.4 94.7  PLT 325 300 307    Cardiac Enzymes: No results for input(s): CKTOTAL, CKMB, CKMBINDEX, TROPONINI in the last 168 hours.  BNP (last 3 results) Recent Labs    01/02/19 0613  BNP 604.8*    ProBNP (last 3 results) No results for input(s): PROBNP in the last 8760 hours.  Radiological Exams: Dg Chest Port 1 View  Result Date: 01/17/2019 CLINICAL DATA:  Pneumonia EXAM: PORTABLE CHEST 1 VIEW COMPARISON:  01/14/2019 FINDINGS: Cardiac shadow is mildly prominent but stable. Tracheostomy tube is noted in satisfactory position. Extrinsic binder is again noted and stable. Bibasilar infiltrates are seen slightly increased on the right when compared with the prior exam. No bony abnormality is noted. IMPRESSION: Bibasilar infiltrates increased on the right when compared with the prior exam. Electronically Signed   By: Inez Catalina M.D.   On: 01/17/2019 07:54    Assessment/Plan Active Problems:   Acute on chronic respiratory failure with hypoxia (HCC)   Bacterial lobar pneumonia   Cervical myelopathy with cervical  radiculopathy   Altered mental status   Chronic atrial fibrillation   1. Acute on chronic respiratory failure with hypoxia continue with T collar as well as pulmonary toilet and supportive care.  Patient using PMV without difficulty may progress to capping soon. 2. Bacterial lobar pneumonia treat continue to monitor 3. Cervical myelopathy unchanged continue supportive care 4. Altered mental status, intermittent 5. Chronic atrial fibrillation rate controlled   I have personally seen and evaluated the patient, evaluated laboratory and imaging results, formulated the assessment and plan and placed orders. The Patient requires high complexity decision making for assessment and support.  Case was discussed on  Rounds with the Respiratory Therapy Staff  Allyne Gee, MD [facility] Pulmonary Critical Care Medicine Sleep Medicine

## 2019-01-18 ENCOUNTER — Other Ambulatory Visit (HOSPITAL_COMMUNITY): Payer: Self-pay

## 2019-01-18 DIAGNOSIS — J159 Unspecified bacterial pneumonia: Secondary | ICD-10-CM | POA: Diagnosis not present

## 2019-01-18 DIAGNOSIS — R4182 Altered mental status, unspecified: Secondary | ICD-10-CM | POA: Diagnosis not present

## 2019-01-18 DIAGNOSIS — J9621 Acute and chronic respiratory failure with hypoxia: Secondary | ICD-10-CM | POA: Diagnosis not present

## 2019-01-18 DIAGNOSIS — M4712 Other spondylosis with myelopathy, cervical region: Secondary | ICD-10-CM | POA: Diagnosis not present

## 2019-01-18 LAB — CBC
HCT: 30.6 % — ABNORMAL LOW (ref 39.0–52.0)
HCT: 31.8 % — ABNORMAL LOW (ref 39.0–52.0)
Hemoglobin: 10 g/dL — ABNORMAL LOW (ref 13.0–17.0)
Hemoglobin: 10.4 g/dL — ABNORMAL LOW (ref 13.0–17.0)
MCH: 30.7 pg (ref 26.0–34.0)
MCH: 30 pg (ref 26.0–34.0)
MCHC: 32.7 g/dL (ref 30.0–36.0)
MCHC: 32.7 g/dL (ref 30.0–36.0)
MCV: 93.9 fL (ref 80.0–100.0)
MCV: 91.6 fL (ref 80.0–100.0)
Platelets: 255 10*3/uL (ref 150–400)
Platelets: 309 10*3/uL (ref 150–400)
RBC: 3.26 MIL/uL — ABNORMAL LOW (ref 4.22–5.81)
RBC: 3.47 MIL/uL — ABNORMAL LOW (ref 4.22–5.81)
RDW: 16.3 % — ABNORMAL HIGH (ref 11.5–15.5)
RDW: 16.1 % — ABNORMAL HIGH (ref 11.5–15.5)
WBC: 22.2 10*3/uL — ABNORMAL HIGH (ref 4.0–10.5)
WBC: 17.6 10*3/uL — ABNORMAL HIGH (ref 4.0–10.5)
nRBC: 0.3 % — ABNORMAL HIGH (ref 0.0–0.2)
nRBC: 0.6 % — ABNORMAL HIGH (ref 0.0–0.2)

## 2019-01-18 LAB — BLOOD GAS, ARTERIAL
Acid-Base Excess: 5.2 mmol/L — ABNORMAL HIGH (ref 0.0–2.0)
Acid-Base Excess: 4.5 mmol/L — ABNORMAL HIGH (ref 0.0–2.0)
Bicarbonate: 28 mmol/L (ref 20.0–28.0)
Bicarbonate: 27.9 mmol/L (ref 20.0–28.0)
FIO2: 0.6
FIO2: 1
O2 Saturation: 92.5 %
O2 Saturation: 98.9 %
PEEP: 5 cmH2O
Patient temperature: 97.7
Patient temperature: 98.6
RATE: 12 resp/min
pCO2 arterial: 31.8 mmHg — ABNORMAL LOW (ref 32.0–48.0)
pCO2 arterial: 37.9 mmHg (ref 32.0–48.0)
pH, Arterial: 7.551 — ABNORMAL HIGH (ref 7.350–7.450)
pH, Arterial: 7.481 — ABNORMAL HIGH (ref 7.350–7.450)
pO2, Arterial: 61.2 mmHg — ABNORMAL LOW (ref 83.0–108.0)
pO2, Arterial: 141 mmHg — ABNORMAL HIGH (ref 83.0–108.0)

## 2019-01-18 LAB — RENAL FUNCTION PANEL
Albumin: 1.9 g/dL — ABNORMAL LOW (ref 3.5–5.0)
Albumin: 1.8 g/dL — ABNORMAL LOW (ref 3.5–5.0)
Anion gap: 17 — ABNORMAL HIGH (ref 5–15)
Anion gap: 17 — ABNORMAL HIGH (ref 5–15)
BUN: 67 mg/dL — ABNORMAL HIGH (ref 8–23)
BUN: 71 mg/dL — ABNORMAL HIGH (ref 8–23)
CO2: 27 mmol/L (ref 22–32)
CO2: 23 mmol/L (ref 22–32)
Calcium: 9 mg/dL (ref 8.9–10.3)
Calcium: 8.6 mg/dL — ABNORMAL LOW (ref 8.9–10.3)
Chloride: 83 mmol/L — ABNORMAL LOW (ref 98–111)
Chloride: 85 mmol/L — ABNORMAL LOW (ref 98–111)
Creatinine, Ser: 1.63 mg/dL — ABNORMAL HIGH (ref 0.61–1.24)
Creatinine, Ser: 1.86 mg/dL — ABNORMAL HIGH (ref 0.61–1.24)
GFR calc Af Amer: 47 mL/min — ABNORMAL LOW (ref >60)
GFR calc Af Amer: 40 mL/min — ABNORMAL LOW (ref >60)
GFR calc non Af Amer: 41 mL/min — ABNORMAL LOW (ref >60)
GFR calc non Af Amer: 35 mL/min — ABNORMAL LOW (ref >60)
Glucose, Bld: 140 mg/dL — ABNORMAL HIGH (ref 70–99)
Glucose, Bld: 151 mg/dL — ABNORMAL HIGH (ref 70–99)
Phosphorus: 5 mg/dL — ABNORMAL HIGH (ref 2.5–4.6)
Phosphorus: 4.7 mg/dL — ABNORMAL HIGH (ref 2.5–4.6)
Potassium: 4.2 mmol/L (ref 3.5–5.1)
Potassium: 4.3 mmol/L (ref 3.5–5.1)
Sodium: 127 mmol/L — ABNORMAL LOW (ref 135–145)
Sodium: 125 mmol/L — ABNORMAL LOW (ref 135–145)

## 2019-01-18 LAB — URINALYSIS, ROUTINE W REFLEX MICROSCOPIC
Bilirubin Urine: NEGATIVE
Glucose, UA: NEGATIVE mg/dL
Hgb urine dipstick: NEGATIVE
Ketones, ur: NEGATIVE mg/dL
Leukocytes,Ua: NEGATIVE
Nitrite: NEGATIVE
Protein, ur: NEGATIVE mg/dL
Specific Gravity, Urine: 1.011 (ref 1.005–1.030)
pH: 7 (ref 5.0–8.0)

## 2019-01-18 LAB — PROCALCITONIN: Procalcitonin: 9.22 ng/mL

## 2019-01-18 LAB — LACTIC ACID, PLASMA: Lactic Acid, Venous: 5.6 mmol/L (ref 0.5–1.9)

## 2019-01-18 LAB — MAGNESIUM
Magnesium: 2.5 mg/dL — ABNORMAL HIGH (ref 1.7–2.4)
Magnesium: 2.3 mg/dL (ref 1.7–2.4)

## 2019-01-18 NOTE — Progress Notes (Addendum)
Pulmonary Critical Care Medicine [place]   PULMONARY CRITICAL CARE SERVICE  PROGRESS NOTE  Date of Service: 01/18/2019  Roger Nelson  EPP:[id number]  DOB: 03/21/1941   DOA: 12/18/2018  Referring Physician: Merton Border, MD  HPI: Roger Nelson is a 78 y.o. male seen for follow up of Acute on Chronic Respiratory Failure.  Patient was descending overnight and found to have what appeared to be tube feed coming out around his trach.  It appears that the patient quite possibly could have aspirated.  His trach was changed to a #6 XLT and he is placed back on the vent.  Medications: Reviewed on Rounds  Physical Exam:  Vitals: Pulse 87 respirations 22 BP 97/49 O2 sat 1% temp 92.5  Ventilator Settings ventilator mode AC PC rate of 12 IP 14 PEEP of 5 FiO2 80%  . General: Comfortable at this time . Eyes: Grossly normal lids, irises & conjunctiva . ENT: grossly tongue is normal . Neck: no obvious mass . Cardiovascular: S1 S2 normal no gallop . Respiratory: Coarse breath sounds . Abdomen: soft . Skin: no rash seen on limited exam . Musculoskeletal: not rigid . Psychiatric:unable to assess . Neurologic: no seizure no involuntary movements         Lab Data:   Basic Metabolic Panel: Recent Labs  Lab 01/13/19 0459  01/14/19 0512 01/15/19 0628 01/17/19 0444 01/18/19 0335 01/18/19 1532 01/19/19 0602  NA 143  --  146* 142 133* 127* 125* 125*  K 2.7*   < > 3.3* 4.0 3.3* 4.2 4.3 3.9  CL 100  --  101 102 89* 83* 85* 83*  CO2 29  --  32 34* 30 27 23 24   GLUCOSE 166*  --  169* 143* 131* 140* 151* 144*  BUN 36*  --  44* 46* 54* 67* 71* 76*  CREATININE 0.72  --  0.76 0.86 1.13 1.63* 1.86* 1.89*  CALCIUM 8.7*  --  9.6 7.5* 9.4 9.0 8.6* 8.8*  MG 2.2  --  2.3  --  2.4 2.5* 2.3  --   PHOS  --   --  4.0  --  4.1 5.0* 4.7* 5.1*   < > = values in this interval not displayed.    ABG: Recent Labs  Lab 01/18/19 0208 01/18/19 0620 01/19/19 0530  PHART 7.551* 7.481*  7.526*  PCO2ART 31.8* 37.9 30.7*  PO2ART 61.2* 141* 92.8  HCO3 28.0 27.9 25.1  O2SAT 92.5 98.9 97.6    Liver Function Tests: Recent Labs  Lab 01/18/19 0335 01/18/19 1532 01/19/19 0602  ALBUMIN 1.9* 1.8* 1.7*   No results for input(s): LIPASE, AMYLASE in the last 168 hours. No results for input(s): AMMONIA in the last 168 hours.  CBC: Recent Labs  Lab 01/14/19 0512 01/17/19 0444 01/18/19 0335 01/18/19 1532 01/19/19 0602  WBC 11.2* 20.1* 22.2* 17.6* 27.4*  HGB 11.4* 10.1* 10.0* 10.4* 10.4*  HCT 36.1* 32.1* 30.6* 31.8* 31.6*  MCV 98.4 94.7 93.9 91.6 91.9  PLT 300 307 255 309 327    Cardiac Enzymes: No results for input(s): CKTOTAL, CKMB, CKMBINDEX, TROPONINI in the last 168 hours.  BNP (last 3 results) Recent Labs    01/02/19 0613  BNP 604.8*    ProBNP (last 3 results) No results for input(s): PROBNP in the last 8760 hours.  Radiological Exams: Dg Chest Port 1 View  Result Date: 01/19/2019 CLINICAL DATA:  Aspiration pneumonia EXAM: PORTABLE CHEST 1 VIEW COMPARISON:  Yesterday FINDINGS: Bilateral interstitial and airspace  opacity which may have increased in the right upper chest. Cardiopericardial enlargement. Tracheostomy tube in place. IMPRESSION: 1. Extensive lung opacification that has worsened on the right at least. 2. Low lung volumes and cardiomegaly. Electronically Signed   By: Monte Fantasia M.D.   On: 01/19/2019 08:10   Dg Chest Port 1 View  Result Date: 01/18/2019 CLINICAL DATA:  Respiratory distress EXAM: PORTABLE CHEST 1 VIEW COMPARISON:  01/17/2019 FINDINGS: Tracheostomy tube tip is at the level of the clavicular heads. There is cardiomegaly. Bilateral basilar predominant opacities are unchanged. No pneumothorax or sizable pleural effusion. IMPRESSION: Unchanged bilateral basilar predominant opacities. Electronically Signed   By: Ulyses Jarred M.D.   On: 01/18/2019 06:38    Assessment/Plan Active Problems:   Acute on chronic respiratory failure with  hypoxia (HCC)   Bacterial lobar pneumonia   Cervical myelopathy with cervical radiculopathy   Altered mental status   Chronic atrial fibrillation   1. Acute on chronic story failure with hypoxia continue on the ventilator until this aspiration can be worked up completely.  May continue to wean as patient will tolerate. 2. Bacterial lobar pneumonia treated continue to monitor 3. Cervical myelopathy unchanged continue supportive care 4. Altered mental status intermittent 5. Chronic atrial fibrillation rate controlled   I have personally seen and evaluated the patient, evaluated laboratory and imaging results, formulated the assessment and plan and placed orders. The Patient requires high complexity decision making for assessment and support.  Case was discussed on Rounds with the Respiratory Therapy Staff  Allyne Gee, MD [facility] Pulmonary Critical Care Medicine Sleep Medicine

## 2019-01-19 ENCOUNTER — Other Ambulatory Visit (HOSPITAL_COMMUNITY): Payer: Self-pay

## 2019-01-19 DIAGNOSIS — M4712 Other spondylosis with myelopathy, cervical region: Secondary | ICD-10-CM | POA: Diagnosis not present

## 2019-01-19 DIAGNOSIS — J159 Unspecified bacterial pneumonia: Secondary | ICD-10-CM | POA: Diagnosis not present

## 2019-01-19 DIAGNOSIS — J9621 Acute and chronic respiratory failure with hypoxia: Secondary | ICD-10-CM | POA: Diagnosis not present

## 2019-01-19 DIAGNOSIS — R4182 Altered mental status, unspecified: Secondary | ICD-10-CM | POA: Diagnosis not present

## 2019-01-19 LAB — BLOOD GAS, ARTERIAL
Acid-Base Excess: 2.5 mmol/L — ABNORMAL HIGH (ref 0.0–2.0)
Acid-Base Excess: 1.5 mmol/L (ref 0.0–2.0)
Bicarbonate: 25.1 mmol/L (ref 20.0–28.0)
Bicarbonate: 25.4 mmol/L (ref 20.0–28.0)
FIO2: 65
FIO2: 100
O2 Saturation: 97.6 %
O2 Saturation: 98.1 %
PEEP: 5 cmH2O
Patient temperature: 99.3
Patient temperature: 98.6
Pressure control: 14 cmH2O
Pressure control: 12 cmH2O
RATE: 12 resp/min
RATE: 12 resp/min
pCO2 arterial: 30.7 mmHg — ABNORMAL LOW (ref 32.0–48.0)
pCO2 arterial: 38.6 mmHg (ref 32.0–48.0)
pH, Arterial: 7.526 — ABNORMAL HIGH (ref 7.350–7.450)
pH, Arterial: 7.433 (ref 7.350–7.450)
pO2, Arterial: 92.8 mmHg (ref 83.0–108.0)
pO2, Arterial: 111 mmHg — ABNORMAL HIGH (ref 83.0–108.0)

## 2019-01-19 LAB — URINE CULTURE: Culture: NO GROWTH

## 2019-01-19 LAB — CBC
HCT: 31.6 % — ABNORMAL LOW (ref 39.0–52.0)
Hemoglobin: 10.4 g/dL — ABNORMAL LOW (ref 13.0–17.0)
MCH: 30.2 pg (ref 26.0–34.0)
MCHC: 32.9 g/dL (ref 30.0–36.0)
MCV: 91.9 fL (ref 80.0–100.0)
Platelets: 327 10*3/uL (ref 150–400)
RBC: 3.44 MIL/uL — ABNORMAL LOW (ref 4.22–5.81)
RDW: 16.5 % — ABNORMAL HIGH (ref 11.5–15.5)
WBC: 27.4 10*3/uL — ABNORMAL HIGH (ref 4.0–10.5)
nRBC: 0.1 % (ref 0.0–0.2)

## 2019-01-19 LAB — RENAL FUNCTION PANEL
Albumin: 1.7 g/dL — ABNORMAL LOW (ref 3.5–5.0)
Anion gap: 18 — ABNORMAL HIGH (ref 5–15)
BUN: 76 mg/dL — ABNORMAL HIGH (ref 8–23)
CO2: 24 mmol/L (ref 22–32)
Calcium: 8.8 mg/dL — ABNORMAL LOW (ref 8.9–10.3)
Chloride: 83 mmol/L — ABNORMAL LOW (ref 98–111)
Creatinine, Ser: 1.89 mg/dL — ABNORMAL HIGH (ref 0.61–1.24)
GFR calc Af Amer: 39 mL/min — ABNORMAL LOW (ref >60)
GFR calc non Af Amer: 34 mL/min — ABNORMAL LOW (ref >60)
Glucose, Bld: 144 mg/dL — ABNORMAL HIGH (ref 70–99)
Phosphorus: 5.1 mg/dL — ABNORMAL HIGH (ref 2.5–4.6)
Potassium: 3.9 mmol/L (ref 3.5–5.1)
Sodium: 125 mmol/L — ABNORMAL LOW (ref 135–145)

## 2019-01-19 LAB — C-REACTIVE PROTEIN: CRP: 40.5 mg/dL — ABNORMAL HIGH (ref <1.0)

## 2019-01-19 LAB — PROCALCITONIN: Procalcitonin: 17.73 ng/mL

## 2019-01-19 LAB — LACTIC ACID, PLASMA: Lactic Acid, Venous: 4.5 mmol/L (ref 0.5–1.9)

## 2019-01-19 NOTE — Progress Notes (Addendum)
Pulmonary Critical Care Medicine [place]   PULMONARY CRITICAL CARE SERVICE  PROGRESS NOTE  Date of Service: 01/19/2019  Roger Nelson  EFE:[id number]  DOB: [birth year]   DOA: 12/27/2018  Referring Physician: Merton Border, MD  HPI: Roger Nelson is a 78 y.o. male seen for follow up of Acute on Chronic Respiratory Failure.  Patient remains on full support at this time.  No acute distress noted.  Medications: Reviewed on Rounds  Physical Exam:  Vitals: Pulse 131 respirations 20 BP 123/59 O2 sat 99% temp 98.0  Ventilator Settings ventilator mode AC PC rate of 12 IP 12 PEEP 5 FiO2 65%.  . General: Comfortable at this time . Eyes: Grossly normal lids, irises & conjunctiva . ENT: grossly tongue is normal . Neck: no obvious mass . Cardiovascular: S1 S2 normal no gallop . Respiratory: Coarse breath sounds . Abdomen: soft . Skin: no rash seen on limited exam . Musculoskeletal: not rigid . Psychiatric:unable to assess . Neurologic: no seizure no involuntary movements         Lab Data:   Basic Metabolic Panel: Recent Labs  Lab 01/14/19 0512  01/17/19 0444 01/18/19 0335 01/18/19 1532 01/19/19 0602 01/20/19 0558  NA 146*   < > 133* 127* 125* 125* 126*  K 3.3*   < > 3.3* 4.2 4.3 3.9 3.6  CL 101   < > 89* 83* 85* 83* 88*  CO2 32   < > 30 27 23 24 23   GLUCOSE 169*   < > 131* 140* 151* 144* 175*  BUN 44*   < > 54* 67* 71* 76* 77*  CREATININE 0.76   < > 1.13 1.63* 1.86* 1.89* 1.62*  CALCIUM 9.6   < > 9.4 9.0 8.6* 8.8* 8.4*  MG 2.3  --  2.4 2.5* 2.3  --  2.3  PHOS 4.0  --  4.1 5.0* 4.7* 5.1* 5.1*   < > = values in this interval not displayed.    ABG: Recent Labs  Lab 01/18/19 0208 01/18/19 0620 01/19/19 0530 01/19/19 2236  PHART 7.551* 7.481* 7.526* 7.433  PCO2ART 31.8* 37.9 30.7* 38.6  PO2ART 61.2* 141* 92.8 111*  HCO3 28.0 27.9 25.1 25.4  O2SAT 92.5 98.9 97.6 98.1    Liver Function Tests: Recent Labs  Lab 01/18/19 0335 01/18/19 1532  01/19/19 0602 01/20/19 0558  ALBUMIN 1.9* 1.8* 1.7* 1.6*   No results for input(s): LIPASE, AMYLASE in the last 168 hours. No results for input(s): AMMONIA in the last 168 hours.  CBC: Recent Labs  Lab 01/17/19 0444 01/18/19 0335 01/18/19 1532 01/19/19 0602 01/20/19 0558  WBC 20.1* 22.2* 17.6* 27.4* 22.3*  HGB 10.1* 10.0* 10.4* 10.4* 9.9*  HCT 32.1* 30.6* 31.8* 31.6* 29.9*  MCV 94.7 93.9 91.6 91.9 92.3  PLT 307 255 309 327 311    Cardiac Enzymes: No results for input(s): CKTOTAL, CKMB, CKMBINDEX, TROPONINI in the last 168 hours.  BNP (last 3 results) Recent Labs    01/02/19 0613  BNP 604.8*    ProBNP (last 3 results) No results for input(s): PROBNP in the last 8760 hours.  Radiological Exams: Dg Chest Port 1 View  Result Date: 01/20/2019 CLINICAL DATA:  Ventilator dependence.  Pneumonia. EXAM: PORTABLE CHEST 1 VIEW COMPARISON:  Yesterday FINDINGS: Tracheostomy tube in place. Bilateral airspace disease, dense behind the heart and more extensive on the right. There is a mild improvement in aeration since yesterday cardiomegaly. No visible effusion or pneumothorax. IMPRESSION: Bilateral pneumonia.  Mildly improved  lung volumes. Electronically Signed   By: Monte Fantasia M.D.   On: 01/20/2019 07:37   Dg Chest Port 1 View  Result Date: 01/19/2019 CLINICAL DATA:  Aspiration pneumonia EXAM: PORTABLE CHEST 1 VIEW COMPARISON:  Yesterday FINDINGS: Bilateral interstitial and airspace opacity which may have increased in the right upper chest. Cardiopericardial enlargement. Tracheostomy tube in place. IMPRESSION: 1. Extensive lung opacification that has worsened on the right at least. 2. Low lung volumes and cardiomegaly. Electronically Signed   By: Monte Fantasia M.D.   On: 01/19/2019 08:10    Assessment/Plan Active Problems:   Acute on chronic respiratory failure with hypoxia (HCC)   Bacterial lobar pneumonia   Cervical myelopathy with cervical radiculopathy   Altered mental  status   Chronic atrial fibrillation   1. Acute chronic respiratory failure with hypoxia continue on ventilator and continue to titrate FiO2 as tolerated. 2. Bacterial lobar pneumonia treated continue to monitor 3. Cervical myelopathy unchanged continue supportive care 4. Altered mental status intermittent 5. Chronic atrial fibrillation rate controlled   I have personally seen and evaluated the patient, evaluated laboratory and imaging results, formulated the assessment and plan and placed orders. The Patient requires high complexity decision making for assessment and support.  Case was discussed on Rounds with the Respiratory Therapy Staff  Allyne Gee, MD [facility] Pulmonary Critical Care Medicine Sleep Medicine

## 2019-01-20 ENCOUNTER — Other Ambulatory Visit (HOSPITAL_COMMUNITY): Payer: Self-pay

## 2019-01-20 DIAGNOSIS — M4712 Other spondylosis with myelopathy, cervical region: Secondary | ICD-10-CM | POA: Diagnosis not present

## 2019-01-20 DIAGNOSIS — J9621 Acute and chronic respiratory failure with hypoxia: Secondary | ICD-10-CM | POA: Diagnosis not present

## 2019-01-20 DIAGNOSIS — R4182 Altered mental status, unspecified: Secondary | ICD-10-CM | POA: Diagnosis not present

## 2019-01-20 DIAGNOSIS — J159 Unspecified bacterial pneumonia: Secondary | ICD-10-CM | POA: Diagnosis not present

## 2019-01-20 LAB — BLOOD CULTURE ID PANEL (REFLEXED)

## 2019-01-20 LAB — RENAL FUNCTION PANEL
Albumin: 1.6 g/dL — ABNORMAL LOW (ref 3.5–5.0)
Anion gap: 15 (ref 5–15)
BUN: 77 mg/dL — ABNORMAL HIGH (ref 8–23)
CO2: 23 mmol/L (ref 22–32)
Calcium: 8.4 mg/dL — ABNORMAL LOW (ref 8.9–10.3)
Chloride: 88 mmol/L — ABNORMAL LOW (ref 98–111)
Creatinine, Ser: 1.62 mg/dL — ABNORMAL HIGH (ref 0.61–1.24)
GFR calc Af Amer: 47 mL/min — ABNORMAL LOW (ref >60)
GFR calc non Af Amer: 41 mL/min — ABNORMAL LOW (ref >60)
Glucose, Bld: 175 mg/dL — ABNORMAL HIGH (ref 70–99)
Phosphorus: 5.1 mg/dL — ABNORMAL HIGH (ref 2.5–4.6)
Potassium: 3.6 mmol/L (ref 3.5–5.1)
Sodium: 126 mmol/L — ABNORMAL LOW (ref 135–145)

## 2019-01-20 LAB — CBC
HCT: 29.9 % — ABNORMAL LOW (ref 39.0–52.0)
Hemoglobin: 9.9 g/dL — ABNORMAL LOW (ref 13.0–17.0)
MCH: 30.6 pg (ref 26.0–34.0)
MCHC: 33.1 g/dL (ref 30.0–36.0)
MCV: 92.3 fL (ref 80.0–100.0)
Platelets: 311 10*3/uL (ref 150–400)
RBC: 3.24 MIL/uL — ABNORMAL LOW (ref 4.22–5.81)
RDW: 16.5 % — ABNORMAL HIGH (ref 11.5–15.5)
WBC: 22.3 10*3/uL — ABNORMAL HIGH (ref 4.0–10.5)
nRBC: 0.1 % (ref 0.0–0.2)

## 2019-01-20 LAB — CULTURE, RESPIRATORY

## 2019-01-20 LAB — CULTURE, RESPIRATORY W GRAM STAIN

## 2019-01-20 LAB — LACTIC ACID, PLASMA: Lactic Acid, Venous: 2.1 mmol/L (ref 0.5–1.9)

## 2019-01-20 LAB — PROCALCITONIN: Procalcitonin: 11.22 ng/mL

## 2019-01-20 LAB — MAGNESIUM: Magnesium: 2.3 mg/dL (ref 1.7–2.4)

## 2019-01-20 NOTE — Progress Notes (Addendum)
Pulmonary Critical Care Medicine [place]   PULMONARY CRITICAL CARE SERVICE  PROGRESS NOTE  Date of Service: 01/20/2019  Roger Nelson  GNF:[id number]  DOB: [birth year]   DOA: 01/08/2019  Referring Physician: Merton Border, MD  HPI: Roger Nelson is a 78 y.o. male seen for follow up of Acute on Chronic Respiratory Failure.  Patient remains on full vent support at this time requiring an FiO2 95%.  Medications: Reviewed on Rounds  Physical Exam:  Vitals: Pulse 123 respirations 20 BP 123/68 O2 sat 97% temp 98.2  Ventilator Settings ventilator mode AC PC rate of 12 IP 12 PEEP of 7 FiO2 95%  . General: Comfortable at this time . Eyes: Grossly normal lids, irises & conjunctiva . ENT: grossly tongue is normal . Neck: no obvious mass . Cardiovascular: S1 S2 normal no gallop . Respiratory: Coarse breath sounds . Abdomen: soft . Skin: no rash seen on limited exam . Musculoskeletal: not rigid . Psychiatric:unable to assess . Neurologic: no seizure no involuntary movements         Lab Data:   Basic Metabolic Panel: Recent Labs  Lab 01/14/19 0512  01/17/19 0444 01/18/19 0335 01/18/19 1532 01/19/19 0602 01/20/19 0558  NA 146*   < > 133* 127* 125* 125* 126*  K 3.3*   < > 3.3* 4.2 4.3 3.9 3.6  CL 101   < > 89* 83* 85* 83* 88*  CO2 32   < > 30 27 23 24 23   GLUCOSE 169*   < > 131* 140* 151* 144* 175*  BUN 44*   < > 54* 67* 71* 76* 77*  CREATININE 0.76   < > 1.13 1.63* 1.86* 1.89* 1.62*  CALCIUM 9.6   < > 9.4 9.0 8.6* 8.8* 8.4*  MG 2.3  --  2.4 2.5* 2.3  --  2.3  PHOS 4.0  --  4.1 5.0* 4.7* 5.1* 5.1*   < > = values in this interval not displayed.    ABG: Recent Labs  Lab 01/18/19 0208 01/18/19 0620 01/19/19 0530 01/19/19 2236  PHART 7.551* 7.481* 7.526* 7.433  PCO2ART 31.8* 37.9 30.7* 38.6  PO2ART 61.2* 141* 92.8 111*  HCO3 28.0 27.9 25.1 25.4  O2SAT 92.5 98.9 97.6 98.1    Liver Function Tests: Recent Labs  Lab 01/18/19 0335 01/18/19 1532  01/19/19 0602 01/20/19 0558  ALBUMIN 1.9* 1.8* 1.7* 1.6*   No results for input(s): LIPASE, AMYLASE in the last 168 hours. No results for input(s): AMMONIA in the last 168 hours.  CBC: Recent Labs  Lab 01/17/19 0444 01/18/19 0335 01/18/19 1532 01/19/19 0602 01/20/19 0558  WBC 20.1* 22.2* 17.6* 27.4* 22.3*  HGB 10.1* 10.0* 10.4* 10.4* 9.9*  HCT 32.1* 30.6* 31.8* 31.6* 29.9*  MCV 94.7 93.9 91.6 91.9 92.3  PLT 307 255 309 327 311    Cardiac Enzymes: No results for input(s): CKTOTAL, CKMB, CKMBINDEX, TROPONINI in the last 168 hours.  BNP (last 3 results) Recent Labs    01/02/19 0613  BNP 604.8*    ProBNP (last 3 results) No results for input(s): PROBNP in the last 8760 hours.  Radiological Exams: Dg Chest Port 1 View  Result Date: 01/20/2019 CLINICAL DATA:  Ventilator dependence.  Pneumonia. EXAM: PORTABLE CHEST 1 VIEW COMPARISON:  Yesterday FINDINGS: Tracheostomy tube in place. Bilateral airspace disease, dense behind the heart and more extensive on the right. There is a mild improvement in aeration since yesterday cardiomegaly. No visible effusion or pneumothorax. IMPRESSION: Bilateral pneumonia.  Mildly  improved lung volumes. Electronically Signed   By: Monte Fantasia M.D.   On: 01/20/2019 07:37   Dg Chest Port 1 View  Result Date: 01/19/2019 CLINICAL DATA:  Aspiration pneumonia EXAM: PORTABLE CHEST 1 VIEW COMPARISON:  Yesterday FINDINGS: Bilateral interstitial and airspace opacity which may have increased in the right upper chest. Cardiopericardial enlargement. Tracheostomy tube in place. IMPRESSION: 1. Extensive lung opacification that has worsened on the right at least. 2. Low lung volumes and cardiomegaly. Electronically Signed   By: Monte Fantasia M.D.   On: 01/19/2019 08:10    Assessment/Plan Active Problems:   Acute on chronic respiratory failure with hypoxia (HCC)   Bacterial lobar pneumonia   Cervical myelopathy with cervical radiculopathy   Altered mental  status   Chronic atrial fibrillation   1. Acute on chronic respiratory failure with hypoxia continue on the ventilator full support and titrate FiO2 as tolerated.  Continue aggressive pulmonary toilet and secretion management. 2. Bacterial lobar pneumonia treated continue to monitor 3. Cervical myelopathy unchanged continue supportive care 4. Altered mental status intermittent 5. Chronic atrial fibrillation rate controlled   I have personally seen and evaluated the patient, evaluated laboratory and imaging results, formulated the assessment and plan and placed orders. The Patient requires high complexity decision making for assessment and support.  Case was discussed on Rounds with the Respiratory Therapy Staff  Allyne Gee, MD [facility] Pulmonary Critical Care Medicine Sleep Medicine

## 2019-01-21 ENCOUNTER — Other Ambulatory Visit (HOSPITAL_COMMUNITY): Payer: Self-pay

## 2019-01-21 DIAGNOSIS — J9621 Acute and chronic respiratory failure with hypoxia: Secondary | ICD-10-CM | POA: Diagnosis not present

## 2019-01-21 DIAGNOSIS — J159 Unspecified bacterial pneumonia: Secondary | ICD-10-CM | POA: Diagnosis not present

## 2019-01-21 DIAGNOSIS — M4712 Other spondylosis with myelopathy, cervical region: Secondary | ICD-10-CM | POA: Diagnosis not present

## 2019-01-21 DIAGNOSIS — R4182 Altered mental status, unspecified: Secondary | ICD-10-CM | POA: Diagnosis not present

## 2019-01-21 LAB — T4, FREE: Free T4: 0.68 ng/dL — ABNORMAL LOW (ref 0.82–1.77)

## 2019-01-21 LAB — COMPREHENSIVE METABOLIC PANEL
ALT: 29 U/L (ref 0–44)
AST: 28 U/L (ref 15–41)
Albumin: 1.4 g/dL — ABNORMAL LOW (ref 3.5–5.0)
Alkaline Phosphatase: 122 U/L (ref 38–126)
Anion gap: 15 (ref 5–15)
BUN: 86 mg/dL — ABNORMAL HIGH (ref 8–23)
CO2: 20 mmol/L — ABNORMAL LOW (ref 22–32)
Calcium: 8 mg/dL — ABNORMAL LOW (ref 8.9–10.3)
Chloride: 96 mmol/L — ABNORMAL LOW (ref 98–111)
Creatinine, Ser: 1.5 mg/dL — ABNORMAL HIGH (ref 0.61–1.24)
GFR calc Af Amer: 52 mL/min — ABNORMAL LOW (ref >60)
GFR calc non Af Amer: 45 mL/min — ABNORMAL LOW (ref >60)
Glucose, Bld: 142 mg/dL — ABNORMAL HIGH (ref 70–99)
Potassium: 3.1 mmol/L — ABNORMAL LOW (ref 3.5–5.1)
Sodium: 131 mmol/L — ABNORMAL LOW (ref 135–145)
Total Bilirubin: 0.6 mg/dL (ref 0.3–1.2)
Total Protein: 4.9 g/dL — ABNORMAL LOW (ref 6.5–8.1)

## 2019-01-21 LAB — CBC
HCT: 28.4 % — ABNORMAL LOW (ref 39.0–52.0)
Hemoglobin: 8.9 g/dL — ABNORMAL LOW (ref 13.0–17.0)
MCH: 29 pg (ref 26.0–34.0)
MCHC: 31.3 g/dL (ref 30.0–36.0)
MCV: 92.5 fL (ref 80.0–100.0)
Platelets: 228 10*3/uL (ref 150–400)
RBC: 3.07 MIL/uL — ABNORMAL LOW (ref 4.22–5.81)
RDW: 16.1 % — ABNORMAL HIGH (ref 11.5–15.5)
WBC: 19.3 10*3/uL — ABNORMAL HIGH (ref 4.0–10.5)
nRBC: 0.3 % — ABNORMAL HIGH (ref 0.0–0.2)

## 2019-01-21 LAB — LACTIC ACID, PLASMA
Lactic Acid, Venous: 2.3 mmol/L (ref 0.5–1.9)
Lactic Acid, Venous: 2.6 mmol/L (ref 0.5–1.9)
Lactic Acid, Venous: 2.1 mmol/L (ref 0.5–1.9)

## 2019-01-21 LAB — TSH: TSH: 0.14 u[IU]/mL — ABNORMAL LOW (ref 0.350–4.500)

## 2019-01-21 LAB — CORTISOL: Cortisol, Plasma: 16.9 ug/dL

## 2019-01-21 NOTE — Progress Notes (Addendum)
Pulmonary Critical Care Medicine [place]   PULMONARY CRITICAL CARE SERVICE  PROGRESS NOTE  Date of Service: 01/21/2019  Roger Nelson  KDT:[id number]  DOB: [birth year]   DOA: 01/07/2019  Referring Physician: Merton Border, MD  HPI: Roger Nelson is a 78 y.o. male seen for follow up of Acute on Chronic Respiratory Failure.  Patient remains on full support on ventilator.  Respiratory is able to wean FiO2 down to 75% overnight.  Medications: Reviewed on Rounds  Physical Exam:  Vitals: Pulse 98 respirations 19 BP 112/63 O2 sat 90% temp 98.2  Ventilator Settings ventilator mode AC PC rate of 12 IP 12 PEEP of 7 FiO2 75%  . General: Comfortable at this time . Eyes: Grossly normal lids, irises & conjunctiva . ENT: grossly tongue is normal . Neck: no obvious mass . Cardiovascular: S1 S2 normal no gallop . Respiratory: Coarse breath sounds . Abdomen: soft . Skin: no rash seen on limited exam . Musculoskeletal: not rigid . Psychiatric:unable to assess . Neurologic: no seizure no involuntary movements         Lab Data:   Basic Metabolic Panel: Recent Labs  Lab 01/17/19 0444 01/18/19 0335 01/18/19 1532 01/19/19 0602 01/20/19 0558 01/21/19 0738  NA 133* 127* 125* 125* 126* 131*  K 3.3* 4.2 4.3 3.9 3.6 3.1*  CL 89* 83* 85* 83* 88* 96*  CO2 30 27 23 24 23  20*  GLUCOSE 131* 140* 151* 144* 175* 142*  BUN 54* 67* 71* 76* 77* 86*  CREATININE 1.13 1.63* 1.86* 1.89* 1.62* 1.50*  CALCIUM 9.4 9.0 8.6* 8.8* 8.4* 8.0*  MG 2.4 2.5* 2.3  --  2.3  --   PHOS 4.1 5.0* 4.7* 5.1* 5.1*  --     ABG: Recent Labs  Lab 01/18/19 0208 01/18/19 0620 01/19/19 0530 01/19/19 2236  PHART 7.551* 7.481* 7.526* 7.433  PCO2ART 31.8* 37.9 30.7* 38.6  PO2ART 61.2* 141* 92.8 111*  HCO3 28.0 27.9 25.1 25.4  O2SAT 92.5 98.9 97.6 98.1    Liver Function Tests: Recent Labs  Lab 01/18/19 0335 01/18/19 1532 01/19/19 0602 01/20/19 0558 01/21/19 0738  AST  --   --   --   --  28   ALT  --   --   --   --  29  ALKPHOS  --   --   --   --  122  BILITOT  --   --   --   --  0.6  PROT  --   --   --   --  4.9*  ALBUMIN 1.9* 1.8* 1.7* 1.6* 1.4*   No results for input(s): LIPASE, AMYLASE in the last 168 hours. No results for input(s): AMMONIA in the last 168 hours.  CBC: Recent Labs  Lab 01/18/19 0335 01/18/19 1532 01/19/19 0602 01/20/19 0558 01/21/19 0738  WBC 22.2* 17.6* 27.4* 22.3* 19.3*  HGB 10.0* 10.4* 10.4* 9.9* 8.9*  HCT 30.6* 31.8* 31.6* 29.9* 28.4*  MCV 93.9 91.6 91.9 92.3 92.5  PLT 255 309 327 311 228    Cardiac Enzymes: No results for input(s): CKTOTAL, CKMB, CKMBINDEX, TROPONINI in the last 168 hours.  BNP (last 3 results) Recent Labs    01/02/19 0613  BNP 604.8*    ProBNP (last 3 results) No results for input(s): PROBNP in the last 8760 hours.  Radiological Exams: Dg Chest Port 1 View  Result Date: 01/20/2019 CLINICAL DATA:  Ventilator dependence.  Pneumonia. EXAM: PORTABLE CHEST 1 VIEW COMPARISON:  Yesterday FINDINGS:  Tracheostomy tube in place. Bilateral airspace disease, dense behind the heart and more extensive on the right. There is a mild improvement in aeration since yesterday cardiomegaly. No visible effusion or pneumothorax. IMPRESSION: Bilateral pneumonia.  Mildly improved lung volumes. Electronically Signed   By: Monte Fantasia M.D.   On: 01/20/2019 07:37    Assessment/Plan Active Problems:   Acute on chronic respiratory failure with hypoxia (HCC)   Bacterial lobar pneumonia   Cervical myelopathy with cervical radiculopathy   Altered mental status   Chronic atrial fibrillation   1. Acute on chronic respiratory failure with hypoxia continue on full support via ventilator and titrate FiO2 as tolerated.  Currently on 75% FiO2.  Continue pulmonary toilet and secretion management 2. Bacterial lobar pneumonia treated continue to monitor 3. Cervical myelopathy unchanged continue supportive care 4. Altered mental status  intermittent 5. Chronic atrial fibrillation rate controlled   I have personally seen and evaluated the patient, evaluated laboratory and imaging results, formulated the assessment and plan and placed orders. The Patient requires high complexity decision making for assessment and support.  Case was discussed on Rounds with the Respiratory Therapy Staff  Allyne Gee, MD [facility] Pulmonary Critical Care Medicine Sleep Medicine

## 2019-01-22 ENCOUNTER — Other Ambulatory Visit (HOSPITAL_COMMUNITY): Payer: Self-pay

## 2019-01-22 DIAGNOSIS — R4182 Altered mental status, unspecified: Secondary | ICD-10-CM | POA: Diagnosis not present

## 2019-01-22 DIAGNOSIS — M4712 Other spondylosis with myelopathy, cervical region: Secondary | ICD-10-CM | POA: Diagnosis not present

## 2019-01-22 DIAGNOSIS — J159 Unspecified bacterial pneumonia: Secondary | ICD-10-CM | POA: Diagnosis not present

## 2019-01-22 DIAGNOSIS — J9621 Acute and chronic respiratory failure with hypoxia: Secondary | ICD-10-CM | POA: Diagnosis not present

## 2019-01-22 LAB — CBC
HCT: 25.4 % — ABNORMAL LOW (ref 39.0–52.0)
Hemoglobin: 8.1 g/dL — ABNORMAL LOW (ref 13.0–17.0)
MCH: 29.7 pg (ref 26.0–34.0)
MCHC: 31.9 g/dL (ref 30.0–36.0)
MCV: 93 fL (ref 80.0–100.0)
Platelets: 228 10*3/uL (ref 150–400)
RBC: 2.73 MIL/uL — ABNORMAL LOW (ref 4.22–5.81)
RDW: 16.1 % — ABNORMAL HIGH (ref 11.5–15.5)
WBC: 19.5 10*3/uL — ABNORMAL HIGH (ref 4.0–10.5)
nRBC: 0.3 % — ABNORMAL HIGH (ref 0.0–0.2)

## 2019-01-22 LAB — BASIC METABOLIC PANEL
Anion gap: 12 (ref 5–15)
BUN: 86 mg/dL — ABNORMAL HIGH (ref 8–23)
CO2: 25 mmol/L (ref 22–32)
Calcium: 8.1 mg/dL — ABNORMAL LOW (ref 8.9–10.3)
Chloride: 93 mmol/L — ABNORMAL LOW (ref 98–111)
Creatinine, Ser: 1.43 mg/dL — ABNORMAL HIGH (ref 0.61–1.24)
GFR calc Af Amer: 55 mL/min — ABNORMAL LOW (ref >60)
GFR calc non Af Amer: 48 mL/min — ABNORMAL LOW (ref >60)
Glucose, Bld: 168 mg/dL — ABNORMAL HIGH (ref 70–99)
Potassium: 3 mmol/L — ABNORMAL LOW (ref 3.5–5.1)
Sodium: 130 mmol/L — ABNORMAL LOW (ref 135–145)

## 2019-01-22 LAB — CULTURE, BLOOD (ROUTINE X 2)

## 2019-01-22 NOTE — Progress Notes (Addendum)
Pulmonary Critical Care Medicine [place]   PULMONARY CRITICAL CARE SERVICE  PROGRESS NOTE  Date of Service: 01/22/2019  Roger Nelson  CVE:[id number]  DOB: [birth year]   DOA: 01/09/2019  Referring Physician: Merton Border, MD  HPI: Roger Nelson is a 78 y.o. male seen for follow up of Acute on Chronic Respiratory Failure.  Continues on full support on ventilator.  Unfortunately FiO2 was increased overnight back to 90%.  No acute distress noted at this time.  Medications: Reviewed on Rounds  Physical Exam:  Vitals: Pulse 114 respirations 20 BP 126/87 O2 sat 88% temp 90.7  Ventilator Settings ventilator AC PC rate of 12 IP 12 PEEP of 5 FiO2 90%  . General: Comfortable at this time . Eyes: Grossly normal lids, irises & conjunctiva . ENT: grossly tongue is normal . Neck: no obvious mass . Cardiovascular: S1 S2 normal no gallop . Respiratory: Coarse breath sounds . Abdomen: soft . Skin: no rash seen on limited exam . Musculoskeletal: not rigid . Psychiatric:unable to assess . Neurologic: no seizure no involuntary movements         Lab Data:   Basic Metabolic Panel: Recent Labs  Lab 01/17/19 0444 01/18/19 0335 01/18/19 1532 01/19/19 0602 01/20/19 0558 01/21/19 0738 01/22/19 0500  NA 133* 127* 125* 125* 126* 131* 130*  K 3.3* 4.2 4.3 3.9 3.6 3.1* 3.0*  CL 89* 83* 85* 83* 88* 96* 93*  CO2 30 27 23 24 23  20* 25  GLUCOSE 131* 140* 151* 144* 175* 142* 168*  BUN 54* 67* 71* 76* 77* 86* 86*  CREATININE 1.13 1.63* 1.86* 1.89* 1.62* 1.50* 1.43*  CALCIUM 9.4 9.0 8.6* 8.8* 8.4* 8.0* 8.1*  MG 2.4 2.5* 2.3  --  2.3  --   --   PHOS 4.1 5.0* 4.7* 5.1* 5.1*  --   --     ABG: Recent Labs  Lab 01/18/19 0208 01/18/19 0620 01/19/19 0530 01/19/19 2236  PHART 7.551* 7.481* 7.526* 7.433  PCO2ART 31.8* 37.9 30.7* 38.6  PO2ART 61.2* 141* 92.8 111*  HCO3 28.0 27.9 25.1 25.4  O2SAT 92.5 98.9 97.6 98.1    Liver Function Tests: Recent Labs  Lab 01/18/19 0335  01/18/19 1532 01/19/19 0602 01/20/19 0558 01/21/19 0738  AST  --   --   --   --  28  ALT  --   --   --   --  29  ALKPHOS  --   --   --   --  122  BILITOT  --   --   --   --  0.6  PROT  --   --   --   --  4.9*  ALBUMIN 1.9* 1.8* 1.7* 1.6* 1.4*   No results for input(s): LIPASE, AMYLASE in the last 168 hours. No results for input(s): AMMONIA in the last 168 hours.  CBC: Recent Labs  Lab 01/18/19 1532 01/19/19 0602 01/20/19 0558 01/21/19 0738 01/22/19 0500  WBC 17.6* 27.4* 22.3* 19.3* 19.5*  HGB 10.4* 10.4* 9.9* 8.9* 8.1*  HCT 31.8* 31.6* 29.9* 28.4* 25.4*  MCV 91.6 91.9 92.3 92.5 93.0  PLT 309 327 311 228 228    Cardiac Enzymes: No results for input(s): CKTOTAL, CKMB, CKMBINDEX, TROPONINI in the last 168 hours.  BNP (last 3 results) Recent Labs    01/02/19 0613  BNP 604.8*    ProBNP (last 3 results) No results for input(s): PROBNP in the last 8760 hours.  Radiological Exams: Dg Chest [facility]  Result Date: 01/22/2019 CLINICAL DATA:  Pneumonia and sepsis EXAM: PORTABLE CHEST 1 VIEW COMPARISON:  This yesterday FINDINGS: Extensive bilateral airspace disease. Cardiomegaly and vascular pedicle widening. Tracheostomy tube in place. Left-sided PICC with tip at the upper right atrium. IMPRESSION: Unchanged hardware positioning and extensive airspace disease. Electronically Signed   By: Monte Fantasia M.D.   On: 01/22/2019 07:06   Dg Chest Port 1 View  Result Date: 01/21/2019 CLINICAL DATA:  Central catheter repositioning EXAM: PORTABLE CHEST 1 VIEW COMPARISON:  January 21, 2019 study obtained earlier in the day FINDINGS: Central catheter tip is now at the cavoatrial junction. Tracheostomy catheter tip is 3.1 cm above the carina. No pneumothorax. Pulmonary vascular congestion remains with pleural effusions and extensive airspace opacity bilaterally, unchanged. No new opacity evident. Postoperative change in the lower cervical spine region noted. IMPRESSION: Central catheter tip  at cavoatrial junction. Tracheostomy not significantly changed. No pneumothorax. Suspect congestive heart failure with questionable superimposed pneumonia, unchanged from earlier in the day. Electronically Signed   By: Lowella Grip III M.D.   On: 01/21/2019 19:24   Dg Chest Port 1 View  Result Date: 01/21/2019 CLINICAL DATA:  Central catheter placement EXAM: PORTABLE CHEST 1 VIEW COMPARISON:  January 20, 2019 FINDINGS: Tracheostomy tip is 2.9 cm above the carina. Central catheter tip is in the right atrium. No pneumothorax. There is cardiomegaly with pulmonary venous hypertension. There are pleural effusions bilaterally. Home there is patchy alveolar opacity throughout both mid and lower lung zones, increased from 1 day prior. No adenopathy evident. There is postoperative change in the lower cervical spine. IMPRESSION: Tube and catheter positions as described without pneumothorax. Pulmonary vascular congestion with small pleural effusions bilaterally. Areas of airspace consolidation bilaterally may represent edema, although superimposed pneumonia is questioned, particularly in the left lower lobe. Both pneumonia and pulmonary edema may present concurrently. There is significant increased opacity bilaterally compared to 1 day prior. The cardiac silhouette is stable. Electronically Signed   By: Lowella Grip III M.D.   On: 01/21/2019 19:14    Assessment/Plan Active Problems:   Acute on chronic respiratory failure with hypoxia (HCC)   Bacterial lobar pneumonia   Cervical myelopathy with cervical radiculopathy   Altered mental status   Chronic atrial fibrillation   1. Acute on chronic respiratory failure with hypoxia continue on full support on the ventilator and continue titrate O2 as tolerated.  Currently requiring 90% FiO2.  Continue aggressive pulmonary toilet and secretion management. 2. Bacterial lobar pneumonia treated continue monitor 3. Cervical myelopathy unchanged continue supportive  care 4. Altered mental status intermittent 5. Chronic atrial fibrillation rate controlled   I have personally seen and evaluated the patient, evaluated laboratory and imaging results, formulated the assessment and plan and placed orders. The Patient requires high complexity decision making for assessment and support.  Case was discussed on Rounds with the Respiratory Therapy Staff  Allyne Gee, MD [facility] Pulmonary Critical Care Medicine Sleep Medicine

## 2019-01-23 ENCOUNTER — Other Ambulatory Visit (HOSPITAL_COMMUNITY): Payer: Self-pay

## 2019-01-23 DIAGNOSIS — R4182 Altered mental status, unspecified: Secondary | ICD-10-CM | POA: Diagnosis not present

## 2019-01-23 DIAGNOSIS — M4712 Other spondylosis with myelopathy, cervical region: Secondary | ICD-10-CM | POA: Diagnosis not present

## 2019-01-23 DIAGNOSIS — J9621 Acute and chronic respiratory failure with hypoxia: Secondary | ICD-10-CM | POA: Diagnosis not present

## 2019-01-23 DIAGNOSIS — J159 Unspecified bacterial pneumonia: Secondary | ICD-10-CM | POA: Diagnosis not present

## 2019-01-23 LAB — CULTURE, BLOOD (ROUTINE X 2): Culture: NO GROWTH

## 2019-01-23 LAB — CBC
HCT: 25.1 % — ABNORMAL LOW (ref 39.0–52.0)
Hemoglobin: 8.2 g/dL — ABNORMAL LOW (ref 13.0–17.0)
MCH: 31.3 pg (ref 26.0–34.0)
MCHC: 32.7 g/dL (ref 30.0–36.0)
MCV: 95.8 fL (ref 80.0–100.0)
Platelets: 336 10*3/uL (ref 150–400)
RBC: 2.62 MIL/uL — ABNORMAL LOW (ref 4.22–5.81)
RDW: 17 % — ABNORMAL HIGH (ref 11.5–15.5)
WBC: 15.7 10*3/uL — ABNORMAL HIGH (ref 4.0–10.5)
nRBC: 0 % (ref 0.0–0.2)

## 2019-01-23 LAB — COMPREHENSIVE METABOLIC PANEL
ALT: 23 U/L (ref 0–44)
AST: 63 U/L — ABNORMAL HIGH (ref 15–41)
Albumin: 1.6 g/dL — ABNORMAL LOW (ref 3.5–5.0)
Alkaline Phosphatase: 135 U/L — ABNORMAL HIGH (ref 38–126)
Anion gap: 13 (ref 5–15)
BUN: 88 mg/dL — ABNORMAL HIGH (ref 8–23)
CO2: 22 mmol/L (ref 22–32)
Calcium: 8.3 mg/dL — ABNORMAL LOW (ref 8.9–10.3)
Chloride: 96 mmol/L — ABNORMAL LOW (ref 98–111)
Creatinine, Ser: 1.57 mg/dL — ABNORMAL HIGH (ref 0.61–1.24)
GFR calc Af Amer: 49 mL/min — ABNORMAL LOW (ref >60)
GFR calc non Af Amer: 42 mL/min — ABNORMAL LOW (ref >60)
Glucose, Bld: 168 mg/dL — ABNORMAL HIGH (ref 70–99)
Potassium: 4.9 mmol/L (ref 3.5–5.1)
Sodium: 131 mmol/L — ABNORMAL LOW (ref 135–145)
Total Bilirubin: 1.7 mg/dL — ABNORMAL HIGH (ref 0.3–1.2)
Total Protein: 4.7 g/dL — ABNORMAL LOW (ref 6.5–8.1)

## 2019-01-23 LAB — MAGNESIUM: Magnesium: 2.5 mg/dL — ABNORMAL HIGH (ref 1.7–2.4)

## 2019-01-23 NOTE — Progress Notes (Addendum)
Pulmonary Critical Care Medicine [place]   PULMONARY CRITICAL CARE SERVICE  PROGRESS NOTE  Date of Service: 01/23/2019  DEVERY MURGIA  MKL:[id number]  DOB: 1941-09-14   DOA: 12/25/2018  Referring Physician: Merton Border, MD  HPI: Roger Nelson is a 78 y.o. male seen for follow up of Acute on Chronic Respiratory Failure.  Patient remains on full support on the ventilator.  RT was able to titrate FiO2 down to 85%.  No distress noted at this time  Medications: Reviewed on Rounds  Physical Exam:  Vitals: Pulse 107 respirations 18 BP 136/74 O2 sat 93% temp 96.0  Ventilator Settings ventilator mode AC PC rate of 12 IP 12 PEEP of 7 FiO2 85%  . General: Comfortable at this time . Eyes: Grossly normal lids, irises & conjunctiva . ENT: grossly tongue is normal . Neck: no obvious mass . Cardiovascular: S1 S2 normal no gallop . Respiratory: Coarse breath sounds . Abdomen: soft . Skin: no rash seen on limited exam . Musculoskeletal: not rigid . Psychiatric:unable to assess . Neurologic: no seizure no involuntary movements         Lab Data:   Basic Metabolic Panel: Recent Labs  Lab 01/17/19 0444 01/18/19 0335 01/18/19 1532 01/19/19 0602 01/20/19 0558 01/21/19 0738 01/22/19 0500 01/23/19 0543  NA 133* 127* 125* 125* 126* 131* 130* 131*  K 3.3* 4.2 4.3 3.9 3.6 3.1* 3.0* 4.9  CL 89* 83* 85* 83* 88* 96* 93* 96*  CO2 30 27 23 24 23  20* 25 22  GLUCOSE 131* 140* 151* 144* 175* 142* 168* 168*  BUN 54* 67* 71* 76* 77* 86* 86* 88*  CREATININE 1.13 1.63* 1.86* 1.89* 1.62* 1.50* 1.43* 1.57*  CALCIUM 9.4 9.0 8.6* 8.8* 8.4* 8.0* 8.1* 8.3*  MG 2.4 2.5* 2.3  --  2.3  --   --  2.5*  PHOS 4.1 5.0* 4.7* 5.1* 5.1*  --   --   --     ABG: Recent Labs  Lab 01/18/19 0208 01/18/19 0620 01/19/19 0530 01/19/19 2236  PHART 7.551* 7.481* 7.526* 7.433  PCO2ART 31.8* 37.9 30.7* 38.6  PO2ART 61.2* 141* 92.8 111*  HCO3 28.0 27.9 25.1 25.4  O2SAT 92.5 98.9 97.6 98.1     Liver Function Tests: Recent Labs  Lab 01/18/19 1532 01/19/19 0602 01/20/19 0558 01/21/19 0738 01/23/19 0543  AST  --   --   --  28 63*  ALT  --   --   --  29 23  ALKPHOS  --   --   --  122 135*  BILITOT  --   --   --  0.6 1.7*  PROT  --   --   --  4.9* 4.7*  ALBUMIN 1.8* 1.7* 1.6* 1.4* 1.6*   No results for input(s): LIPASE, AMYLASE in the last 168 hours. No results for input(s): AMMONIA in the last 168 hours.  CBC: Recent Labs  Lab 01/19/19 0602 01/20/19 0558 01/21/19 0738 01/22/19 0500 01/23/19 0543  WBC 27.4* 22.3* 19.3* 19.5* 15.7*  HGB 10.4* 9.9* 8.9* 8.1* 8.2*  HCT 31.6* 29.9* 28.4* 25.4* 25.1*  MCV 91.9 92.3 92.5 93.0 95.8  PLT 327 311 228 228 336    Cardiac Enzymes: No results for input(s): CKTOTAL, CKMB, CKMBINDEX, TROPONINI in the last 168 hours.  BNP (last 3 results) Recent Labs    01/02/19 0613  BNP 604.8*    ProBNP (last 3 results) No results for input(s): PROBNP in the last 8760 hours.  Radiological Exams: Ct Head Wo Contrast  Result Date: 01/23/2019 CLINICAL DATA:  Altered mental status. EXAM: CT HEAD WITHOUT CONTRAST TECHNIQUE: Contiguous axial images were obtained from the base of the skull through the vertex without intravenous contrast. COMPARISON:  None. FINDINGS: Brain: No acute cortically based infarct, intracranial hemorrhage, mass, midline shift, or extra-axial fluid collection is identified. There is moderately advanced cerebral atrophy. Confluent hypodensities in the cerebral white matter bilaterally are nonspecific but compatible with extensive chronic small-vessel ischemic disease. Motion and streak artifact limits assessment of the posterior fossa. Vascular: Calcified atherosclerosis at the skull base. No hyperdense vessel. Skull: No fracture or focal osseous lesion. Sinuses/Orbits: Right maxillary and left sphenoid sinus osteitis suggesting a history of chronic sinusitis. Mild mucosal thickening in the left sphenoid sinus. Small  right and moderately large left mastoid effusions. Right ocular prosthesis. Other: None. IMPRESSION: 1. No evidence of acute intracranial abnormality. 2. 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 3. Bilateral mastoid effusions. Electronically Signed   By: Logan Bores M.D.   On: 01/23/2019 10:17   Dg Chest Port 1 View  Result Date: 01/22/2019 CLINICAL DATA:  Pneumonia and sepsis EXAM: PORTABLE CHEST 1 VIEW COMPARISON:  This yesterday FINDINGS: Extensive bilateral airspace disease. Cardiomegaly and vascular pedicle widening. Tracheostomy tube in place. Left-sided PICC with tip at the upper right atrium. IMPRESSION: Unchanged hardware positioning and extensive airspace disease. Electronically Signed   By: Monte Fantasia M.D.   On: 01/22/2019 07:06   Dg Chest Port 1 View  Result Date: 01/21/2019 CLINICAL DATA:  Central catheter repositioning EXAM: PORTABLE CHEST 1 VIEW COMPARISON:  January 21, 2019 study obtained earlier in the day FINDINGS: Central catheter tip is now at the cavoatrial junction. Tracheostomy catheter tip is 3.1 cm above the carina. No pneumothorax. Pulmonary vascular congestion remains with pleural effusions and extensive airspace opacity bilaterally, unchanged. No new opacity evident. Postoperative change in the lower cervical spine region noted. IMPRESSION: Central catheter tip at cavoatrial junction. Tracheostomy not significantly changed. No pneumothorax. Suspect congestive heart failure with questionable superimposed pneumonia, unchanged from earlier in the day. Electronically Signed   By: Lowella Grip III M.D.   On: 01/21/2019 19:24   Dg Chest Port 1 View  Result Date: 01/21/2019 CLINICAL DATA:  Central catheter placement EXAM: PORTABLE CHEST 1 VIEW COMPARISON:  January 20, 2019 FINDINGS: Tracheostomy tip is 2.9 cm above the carina. Central catheter tip is in the right atrium. No pneumothorax. There is cardiomegaly with pulmonary venous hypertension. There  are pleural effusions bilaterally. Home there is patchy alveolar opacity throughout both mid and lower lung zones, increased from 1 day prior. No adenopathy evident. There is postoperative change in the lower cervical spine. IMPRESSION: Tube and catheter positions as described without pneumothorax. Pulmonary vascular congestion with small pleural effusions bilaterally. Areas of airspace consolidation bilaterally may represent edema, although superimposed pneumonia is questioned, particularly in the left lower lobe. Both pneumonia and pulmonary edema may present concurrently. There is significant increased opacity bilaterally compared to 1 day prior. The cardiac silhouette is stable. Electronically Signed   By: Lowella Grip III M.D.   On: 01/21/2019 19:14    Assessment/Plan Active Problems:   Acute on chronic respiratory failure with hypoxia (HCC)   Bacterial lobar pneumonia   Cervical myelopathy with cervical radiculopathy   Altered mental status   Chronic atrial fibrillation   1. Acute on chronic respiratory failure with hypoxia continue on full support, continue to titrate O2 as tolerated.  Continue  aggressive pulmonary toilet and secretion management 2. Bacterial lobar pneumonia treated continue to monitor 3. Cervical myelopathy unchanged continue supportive care 4. Altered mental status intermittent 5. Chronic atrial fibrillation rate controlled   I have personally seen and evaluated the patient, evaluated laboratory and imaging results, formulated the assessment and plan and placed orders. The Patient requires high complexity decision making for assessment and support.  Case was discussed on Rounds with the Respiratory Therapy Staff  Allyne Gee, MD [facility] Pulmonary Critical Care Medicine Sleep Medicine

## 2019-01-24 ENCOUNTER — Other Ambulatory Visit (HOSPITAL_COMMUNITY): Payer: Self-pay

## 2019-01-24 DIAGNOSIS — M4712 Other spondylosis with myelopathy, cervical region: Secondary | ICD-10-CM | POA: Diagnosis not present

## 2019-01-24 DIAGNOSIS — J159 Unspecified bacterial pneumonia: Secondary | ICD-10-CM | POA: Diagnosis not present

## 2019-01-24 DIAGNOSIS — J9621 Acute and chronic respiratory failure with hypoxia: Secondary | ICD-10-CM | POA: Diagnosis not present

## 2019-01-24 DIAGNOSIS — R4182 Altered mental status, unspecified: Secondary | ICD-10-CM | POA: Diagnosis not present

## 2019-01-24 LAB — BASIC METABOLIC PANEL
Anion gap: 15 (ref 5–15)
BUN: 101 mg/dL — ABNORMAL HIGH (ref 8–23)
CO2: 21 mmol/L — ABNORMAL LOW (ref 22–32)
Calcium: 8.6 mg/dL — ABNORMAL LOW (ref 8.9–10.3)
Chloride: 96 mmol/L — ABNORMAL LOW (ref 98–111)
Creatinine, Ser: 1.82 mg/dL — ABNORMAL HIGH (ref 0.61–1.24)
GFR calc Af Amer: 41 mL/min — ABNORMAL LOW (ref >60)
GFR calc non Af Amer: 36 mL/min — ABNORMAL LOW (ref >60)
Glucose, Bld: 146 mg/dL — ABNORMAL HIGH (ref 70–99)
Potassium: 4.5 mmol/L (ref 3.5–5.1)
Sodium: 132 mmol/L — ABNORMAL LOW (ref 135–145)

## 2019-01-24 LAB — CBC
HCT: 26.2 % — ABNORMAL LOW (ref 39.0–52.0)
Hemoglobin: 8.2 g/dL — ABNORMAL LOW (ref 13.0–17.0)
MCH: 30.4 pg (ref 26.0–34.0)
MCHC: 31.3 g/dL (ref 30.0–36.0)
MCV: 97 fL (ref 80.0–100.0)
Platelets: 242 10*3/uL (ref 150–400)
RBC: 2.7 MIL/uL — ABNORMAL LOW (ref 4.22–5.81)
RDW: 16.6 % — ABNORMAL HIGH (ref 11.5–15.5)
WBC: 24.2 10*3/uL — ABNORMAL HIGH (ref 4.0–10.5)
nRBC: 0.2 % (ref 0.0–0.2)

## 2019-01-24 LAB — LACTIC ACID, PLASMA
Lactic Acid, Venous: 3.9 mmol/L (ref 0.5–1.9)
Lactic Acid, Venous: 4.1 mmol/L (ref 0.5–1.9)

## 2019-01-24 NOTE — Progress Notes (Addendum)
Pulmonary Critical Care Medicine [place]   PULMONARY CRITICAL CARE SERVICE  PROGRESS NOTE  Date of Service: 01/24/2019  Roger Nelson  YNW:[id number]  DOB: 1941-01-02   DOA: 01/11/2019  Referring Physician: Merton Border, MD  HPI: Roger Nelson is a 78 y.o. male seen for follow up of Acute on Chronic Respiratory Failure.  Patient remains on full support with an FiO2 of 75% today.  No distress noted at this time.  Medications: Reviewed on Rounds  Physical Exam:  Vitals: Pulse 53 respirations 21 BP 132/68 O2 sat 110 temp 97.0  Ventilator Settings ventilator mode AC PC rate 12 inspiratory pressure 12 PEEP of 8 FiO2 75%  . General: Comfortable at this time . Eyes: Grossly normal lids, irises & conjunctiva . ENT: grossly tongue is normal . Neck: no obvious mass . Cardiovascular: S1 S2 normal no gallop . Respiratory: Coarse breath sounds . Abdomen: soft . Skin: no rash seen on limited exam . Musculoskeletal: not rigid . Psychiatric:unable to assess . Neurologic: no seizure no involuntary movements         Lab Data:   Basic Metabolic Panel: Recent Labs  Lab 01/18/19 0335 01/18/19 1532 01/19/19 0602 01/20/19 0558 01/21/19 0738 01/22/19 0500 01/23/19 0543 01/24/19 0608  NA 127* 125* 125* 126* 131* 130* 131* 132*  K 4.2 4.3 3.9 3.6 3.1* 3.0* 4.9 4.5  CL 83* 85* 83* 88* 96* 93* 96* 96*  CO2 27 23 24 23  20* 25 22 21*  GLUCOSE 140* 151* 144* 175* 142* 168* 168* 146*  BUN 67* 71* 76* 77* 86* 86* 88* 101*  CREATININE 1.63* 1.86* 1.89* 1.62* 1.50* 1.43* 1.57* 1.82*  CALCIUM 9.0 8.6* 8.8* 8.4* 8.0* 8.1* 8.3* 8.6*  MG 2.5* 2.3  --  2.3  --   --  2.5*  --   PHOS 5.0* 4.7* 5.1* 5.1*  --   --   --   --     ABG: Recent Labs  Lab 01/18/19 0208 01/18/19 0620 01/19/19 0530 01/19/19 2236  PHART 7.551* 7.481* 7.526* 7.433  PCO2ART 31.8* 37.9 30.7* 38.6  PO2ART 61.2* 141* 92.8 111*  HCO3 28.0 27.9 25.1 25.4  O2SAT 92.5 98.9 97.6 98.1    Liver Function  Tests: Recent Labs  Lab 01/18/19 1532 01/19/19 0602 01/20/19 0558 01/21/19 0738 01/23/19 0543  AST  --   --   --  28 63*  ALT  --   --   --  29 23  ALKPHOS  --   --   --  122 135*  BILITOT  --   --   --  0.6 1.7*  PROT  --   --   --  4.9* 4.7*  ALBUMIN 1.8* 1.7* 1.6* 1.4* 1.6*   No results for input(s): LIPASE, AMYLASE in the last 168 hours. No results for input(s): AMMONIA in the last 168 hours.  CBC: Recent Labs  Lab 01/20/19 0558 01/21/19 0738 01/22/19 0500 01/23/19 0543 01/24/19 0608  WBC 22.3* 19.3* 19.5* 15.7* 24.2*  HGB 9.9* 8.9* 8.1* 8.2* 8.2*  HCT 29.9* 28.4* 25.4* 25.1* 26.2*  MCV 92.3 92.5 93.0 95.8 97.0  PLT 311 228 228 336 242    Cardiac Enzymes: No results for input(s): CKTOTAL, CKMB, CKMBINDEX, TROPONINI in the last 168 hours.  BNP (last 3 results) Recent Labs    01/02/19 0613  BNP 604.8*    ProBNP (last 3 results) No results for input(s): PROBNP in the last 8760 hours.  Radiological Exams: Ct  Head Wo Contrast  Result Date: 01/23/2019 CLINICAL DATA:  Altered mental status. EXAM: CT HEAD WITHOUT CONTRAST TECHNIQUE: Contiguous axial images were obtained from the base of the skull through the vertex without intravenous contrast. COMPARISON:  None. FINDINGS: Brain: No acute cortically based infarct, intracranial hemorrhage, mass, midline shift, or extra-axial fluid collection is identified. There is moderately advanced cerebral atrophy. Confluent hypodensities in the cerebral white matter bilaterally are nonspecific but compatible with extensive chronic small-vessel ischemic disease. Motion and streak artifact limits assessment of the posterior fossa. Vascular: Calcified atherosclerosis at the skull base. No hyperdense vessel. Skull: No fracture or focal osseous lesion. Sinuses/Orbits: Right maxillary and left sphenoid sinus osteitis suggesting a history of chronic sinusitis. Mild mucosal thickening in the left sphenoid sinus. Small right and moderately  large left mastoid effusions. Right ocular prosthesis. Other: None. IMPRESSION: 1. No evidence of acute intracranial abnormality. 2. 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 3. Bilateral mastoid effusions. Electronically Signed   By: Logan Bores M.D.   On: 01/23/2019 10:17    Assessment/Plan Active Problems:   Acute on chronic respiratory failure with hypoxia (HCC)   Bacterial lobar pneumonia   Cervical myelopathy with cervical radiculopathy   Altered mental status   Chronic atrial fibrillation   1. Acute on chronic respiratory failure with hypoxia continue on full support and continue to titrate oxygen as patient tolerates.  Continue pulmonary toilet and secretion management 2. Bacterial lobar pneumonia treated continue to monitor 3. Cervical myelopathy unchanged supportive care 4. Altered mental status intermittent 5. Chronic atrial fibrillation rate controlled   I have personally seen and evaluated the patient, evaluated laboratory and imaging results, formulated the assessment and plan and placed orders. The Patient requires high complexity decision making for assessment and support.  Case was discussed on Rounds with the Respiratory Therapy Staff  Allyne Gee, MD [facility] Pulmonary Critical Care Medicine Sleep Medicine

## 2019-01-25 ENCOUNTER — Other Ambulatory Visit (HOSPITAL_COMMUNITY): Payer: Self-pay

## 2019-01-25 DIAGNOSIS — J80 Acute respiratory distress syndrome: Secondary | ICD-10-CM

## 2019-01-25 DIAGNOSIS — R4182 Altered mental status, unspecified: Secondary | ICD-10-CM | POA: Diagnosis not present

## 2019-01-25 DIAGNOSIS — N179 Acute kidney failure, unspecified: Secondary | ICD-10-CM | POA: Diagnosis not present

## 2019-01-25 DIAGNOSIS — J9621 Acute and chronic respiratory failure with hypoxia: Secondary | ICD-10-CM | POA: Diagnosis not present

## 2019-01-25 DIAGNOSIS — I482 Chronic atrial fibrillation, unspecified: Secondary | ICD-10-CM | POA: Diagnosis not present

## 2019-01-25 LAB — BASIC METABOLIC PANEL
Anion gap: 17 — ABNORMAL HIGH (ref 5–15)
Anion gap: 14 (ref 5–15)
Anion gap: 14 (ref 5–15)
BUN: 113 mg/dL — ABNORMAL HIGH (ref 8–23)
BUN: 118 mg/dL — ABNORMAL HIGH (ref 8–23)
BUN: 118 mg/dL — ABNORMAL HIGH (ref 8–23)
CO2: 20 mmol/L — ABNORMAL LOW (ref 22–32)
CO2: 19 mmol/L — ABNORMAL LOW (ref 22–32)
CO2: 21 mmol/L — ABNORMAL LOW (ref 22–32)
Calcium: 8.5 mg/dL — ABNORMAL LOW (ref 8.9–10.3)
Calcium: 8.4 mg/dL — ABNORMAL LOW (ref 8.9–10.3)
Calcium: 8.4 mg/dL — ABNORMAL LOW (ref 8.9–10.3)
Chloride: 94 mmol/L — ABNORMAL LOW (ref 98–111)
Chloride: 99 mmol/L (ref 98–111)
Chloride: 99 mmol/L (ref 98–111)
Creatinine, Ser: 2.08 mg/dL — ABNORMAL HIGH (ref 0.61–1.24)
Creatinine, Ser: 2.04 mg/dL — ABNORMAL HIGH (ref 0.61–1.24)
Creatinine, Ser: 2.22 mg/dL — ABNORMAL HIGH (ref 0.61–1.24)
GFR calc Af Amer: 35 mL/min — ABNORMAL LOW (ref >60)
GFR calc Af Amer: 36 mL/min — ABNORMAL LOW (ref >60)
GFR calc Af Amer: 32 mL/min — ABNORMAL LOW (ref >60)
GFR calc non Af Amer: 30 mL/min — ABNORMAL LOW (ref >60)
GFR calc non Af Amer: 31 mL/min — ABNORMAL LOW (ref >60)
GFR calc non Af Amer: 28 mL/min — ABNORMAL LOW (ref >60)
Glucose, Bld: 146 mg/dL — ABNORMAL HIGH (ref 70–99)
Glucose, Bld: 118 mg/dL — ABNORMAL HIGH (ref 70–99)
Glucose, Bld: 97 mg/dL (ref 70–99)
Potassium: 5 mmol/L (ref 3.5–5.1)
Potassium: 4.3 mmol/L (ref 3.5–5.1)
Potassium: 4.2 mmol/L (ref 3.5–5.1)
Sodium: 131 mmol/L — ABNORMAL LOW (ref 135–145)
Sodium: 132 mmol/L — ABNORMAL LOW (ref 135–145)
Sodium: 134 mmol/L — ABNORMAL LOW (ref 135–145)

## 2019-01-25 LAB — CBC
HCT: 22.8 % — ABNORMAL LOW (ref 39.0–52.0)
HCT: 24.1 % — ABNORMAL LOW (ref 39.0–52.0)
Hemoglobin: 6.8 g/dL — CL (ref 13.0–17.0)
Hemoglobin: 7.4 g/dL — ABNORMAL LOW (ref 13.0–17.0)
MCH: 29.8 pg (ref 26.0–34.0)
MCH: 29.4 pg (ref 26.0–34.0)
MCHC: 29.8 g/dL — ABNORMAL LOW (ref 30.0–36.0)
MCHC: 30.7 g/dL (ref 30.0–36.0)
MCV: 100 fL (ref 80.0–100.0)
MCV: 95.6 fL (ref 80.0–100.0)
Platelets: 230 10*3/uL (ref 150–400)
Platelets: 177 10*3/uL (ref 150–400)
RBC: 2.28 MIL/uL — ABNORMAL LOW (ref 4.22–5.81)
RBC: 2.52 MIL/uL — ABNORMAL LOW (ref 4.22–5.81)
RDW: 17.2 % — ABNORMAL HIGH (ref 11.5–15.5)
RDW: 17.8 % — ABNORMAL HIGH (ref 11.5–15.5)
WBC: 34.3 10*3/uL — ABNORMAL HIGH (ref 4.0–10.5)
WBC: 29 10*3/uL — ABNORMAL HIGH (ref 4.0–10.5)
nRBC: 0.4 % — ABNORMAL HIGH (ref 0.0–0.2)
nRBC: 0.5 % — ABNORMAL HIGH (ref 0.0–0.2)

## 2019-01-25 LAB — LACTIC ACID, PLASMA
Lactic Acid, Venous: 3.2 mmol/L (ref 0.5–1.9)
Lactic Acid, Venous: 3.4 mmol/L (ref 0.5–1.9)
Lactic Acid, Venous: 3.6 mmol/L (ref 0.5–1.9)
Lactic Acid, Venous: 4.3 mmol/L (ref 0.5–1.9)

## 2019-01-25 LAB — PREPARE RBC (CROSSMATCH)

## 2019-01-25 LAB — OCCULT BLOOD X 1 CARD TO LAB, STOOL: Fecal Occult Bld: POSITIVE — AB

## 2019-01-25 LAB — MAGNESIUM: Magnesium: 2.7 mg/dL — ABNORMAL HIGH (ref 1.7–2.4)

## 2019-01-25 LAB — PROCALCITONIN: Procalcitonin: 2.78 ng/mL

## 2019-01-25 LAB — AMMONIA: Ammonia: 39 umol/L — ABNORMAL HIGH (ref 9–35)

## 2019-01-25 NOTE — Progress Notes (Addendum)
Pulmonary Critical Care Medicine [place]   PULMONARY CRITICAL CARE SERVICE  PROGRESS NOTE  Date of Service: 01/25/2019  Roger Nelson  EPP:[id number]  DOB: November 07, 1940   DOA: 01/06/2019  Referring Physician: Merton Border, MD  HPI: Roger Nelson is a 78 y.o. male seen for follow up of Acute on Chronic Respiratory Failure.  Patients FiO2 increased back to 100% at this time.  Patient has significant desaturation to the 70s with turning or movement.  Remains afebrile this time.  Chest x-ray continues to show increased pulmonary infiltrates looking more like ARDS  Medications: Reviewed on Rounds  Physical Exam:  Vitals: Pulse 64 respirations 18 BP 125/47 O2 sat 95% temp 7.6  Ventilator Settings ventilator mode AC PC rate of 12 and 3 pressure 12 PEEP of 8 FiO2 100%  . General: Comfortable at this time . Eyes: Grossly normal lids, irises & conjunctiva . ENT: grossly tongue is normal . Neck: no obvious mass . Cardiovascular: S1 S2 normal no gallop . Respiratory: Coarse breath sounds . Abdomen: soft . Skin: no rash seen on limited exam . Musculoskeletal: not rigid . Psychiatric:unable to assess . Neurologic: no seizure no involuntary movements         Lab Data:   Basic Metabolic Panel: Recent Labs  Lab 01/19/19 0602 01/20/19 0558  01/22/19 0500 01/23/19 0543 01/24/19 0608 01/25/19 0711 01/25/19 1135  NA 125* 126*   < > 130* 131* 132* 131* 132*  K 3.9 3.6   < > 3.0* 4.9 4.5 5.0 4.3  CL 83* 88*   < > 93* 96* 96* 94* 99  CO2 24 23   < > 25 22 21* 20* 19*  GLUCOSE 144* 175*   < > 168* 168* 146* 146* 118*  BUN 76* 77*   < > 86* 88* 101* 113* 118*  CREATININE 1.89* 1.62*   < > 1.43* 1.57* 1.82* 2.08* 2.04*  CALCIUM 8.8* 8.4*   < > 8.1* 8.3* 8.6* 8.5* 8.4*  MG  --  2.3  --   --  2.5*  --  2.7*  --   PHOS 5.1* 5.1*  --   --   --   --   --   --    < > = values in this interval not displayed.    ABG: Recent Labs  Lab 01/19/19 0530 01/19/19 2236  PHART  7.526* 7.433  PCO2ART 30.7* 38.6  PO2ART 92.8 111*  HCO3 25.1 25.4  O2SAT 97.6 98.1    Liver Function Tests: Recent Labs  Lab 01/19/19 0602 01/20/19 0558 01/21/19 0738 01/23/19 0543  AST  --   --  28 63*  ALT  --   --  29 23  ALKPHOS  --   --  122 135*  BILITOT  --   --  0.6 1.7*  PROT  --   --  4.9* 4.7*  ALBUMIN 1.7* 1.6* 1.4* 1.6*   No results for input(s): LIPASE, AMYLASE in the last 168 hours. No results for input(s): AMMONIA in the last 168 hours.  CBC: Recent Labs  Lab 01/21/19 0738 01/22/19 0500 01/23/19 0543 01/24/19 0608 01/25/19 0711  WBC 19.3* 19.5* 15.7* 24.2* 34.3*  HGB 8.9* 8.1* 8.2* 8.2* 6.8*  HCT 28.4* 25.4* 25.1* 26.2* 22.8*  MCV 92.5 93.0 95.8 97.0 100.0  PLT 228 228 336 242 230    Cardiac Enzymes: No results for input(s): CKTOTAL, CKMB, CKMBINDEX, TROPONINI in the last 168 hours.  BNP (last 3 results)  Recent Labs    01/02/19 0613  BNP 604.8*    ProBNP (last 3 results) No results for input(s): PROBNP in the last 8760 hours.  Radiological Exams: US Renal  Result Date: 01/24/2019 CLINICAL DATA:  Acute kidney injury. EXAM: RENAL / URINARY TRACT ULTRASOUND COMPLETE COMPARISON:  None. FINDINGS: Right Kidney: Renal measurements: 11.5 x 5.2 x 5.7 cm = volume: 179.9 mL . Echogenicity within normal limits. No solid mass or hydronephrosis visualized. 0.8 cm simple cyst noted. Left Kidney: Renal measurements: 13.5 x 6.2 x 6.5 cm = volume: 283.6 mL. Echogenicity within normal limits. No mass or hydronephrosis visualized. Bladder: Completely collapsed with a Foley catheter in place. IMPRESSION: Negative for hydronephrosis.  Negative exam. Electronically Signed   By: Inge Rise M.D.   On: 01/24/2019 15:53   Dg Chest Port 1 View  Result Date: 01/25/2019 CLINICAL DATA:  Chronic ventilator dependent respiratory failure. Follow-up pneumonia. EXAM: PORTABLE CHEST 1 VIEW COMPARISON:  01/22/2019 and earlier. FINDINGS: Tracheostomy tube tip in  satisfactory position below the thoracic inlet. LEFT arm PICC tip difficult to visualize on the current examination. Cardiac silhouette moderately enlarged, unchanged. Dense airspace consolidation in the LEFT LOWER LOBE, confluent airspace consolidation in the BILATERAL UPPER lobes and patchy airspace opacities involving the RIGHT lung base, unchanged since the examination 3 days ago. No new abnormalities. IMPRESSION: 1. Stable diffuse BILATERAL pneumonia, most confluent in the LEFT LOWER LOBE. 2. No new abnormalities. Electronically Signed   By: Evangeline Dakin M.D.   On: 01/25/2019 09:26    Assessment/Plan Active Problems:   Acute on chronic respiratory failure with hypoxia (HCC)   Bacterial lobar pneumonia   Cervical myelopathy with cervical radiculopathy   Altered mental status   Chronic atrial fibrillation   1. Acute on chronic respiratory failure with hypoxia continue full support at this time.  Attempt to titrate oxygen as patient will tolerate.  Continue aggressive pulmonary toilet and secretion management discussed with respiratory therapy regarding the management of the ventilator and changing the strategy to increasing the PEEP 2. Bacterial lobar pneumonia treated continue to monitor 3. Cervical myelopathy unchanged continue supportive care 4. Altered mental status, intermittent 5. Chronic atrial fibrillation rate control   I have personally seen and evaluated the patient, evaluated laboratory and imaging results, formulated the assessment and plan and placed orders.  Time 35 minutes patient is critically ill in danger of cardiac arrest and death The Patient requires high complexity decision making for assessment and support.  Case was discussed on Rounds with the Respiratory Therapy Staff  Allyne Gee, MD [facility] Pulmonary Critical Care Medicine Sleep Medicine

## 2019-01-26 ENCOUNTER — Other Ambulatory Visit (HOSPITAL_COMMUNITY): Payer: Self-pay

## 2019-01-26 DIAGNOSIS — J9621 Acute and chronic respiratory failure with hypoxia: Secondary | ICD-10-CM | POA: Diagnosis not present

## 2019-01-26 DIAGNOSIS — J159 Unspecified bacterial pneumonia: Secondary | ICD-10-CM | POA: Diagnosis not present

## 2019-01-26 DIAGNOSIS — M4712 Other spondylosis with myelopathy, cervical region: Secondary | ICD-10-CM | POA: Diagnosis not present

## 2019-01-26 DIAGNOSIS — R4182 Altered mental status, unspecified: Secondary | ICD-10-CM | POA: Diagnosis not present

## 2019-01-26 LAB — CBC
HCT: 22.8 % — ABNORMAL LOW (ref 39.0–52.0)
Hemoglobin: 7 g/dL — ABNORMAL LOW (ref 13.0–17.0)
MCH: 30.3 pg (ref 26.0–34.0)
MCHC: 30.7 g/dL (ref 30.0–36.0)
MCV: 98.7 fL (ref 80.0–100.0)
Platelets: 204 10*3/uL (ref 150–400)
RBC: 2.31 MIL/uL — ABNORMAL LOW (ref 4.22–5.81)
RDW: 18.6 % — ABNORMAL HIGH (ref 11.5–15.5)
WBC: 40.9 10*3/uL — ABNORMAL HIGH (ref 4.0–10.5)
nRBC: 0.4 % — ABNORMAL HIGH (ref 0.0–0.2)

## 2019-01-26 LAB — LACTIC ACID, PLASMA
Lactic Acid, Venous: 4 mmol/L (ref 0.5–1.9)
Lactic Acid, Venous: 3.6 mmol/L (ref 0.5–1.9)
Lactic Acid, Venous: 3.5 mmol/L (ref 0.5–1.9)

## 2019-01-26 LAB — PREPARE RBC (CROSSMATCH)

## 2019-01-26 LAB — RESPIRATORY PANEL BY PCR

## 2019-01-26 LAB — BASIC METABOLIC PANEL
Anion gap: 13 (ref 5–15)
BUN: 128 mg/dL — ABNORMAL HIGH (ref 8–23)
CO2: 22 mmol/L (ref 22–32)
Calcium: 8.6 mg/dL — ABNORMAL LOW (ref 8.9–10.3)
Chloride: 100 mmol/L (ref 98–111)
Creatinine, Ser: 2.31 mg/dL — ABNORMAL HIGH (ref 0.61–1.24)
GFR calc Af Amer: 31 mL/min — ABNORMAL LOW (ref >60)
GFR calc non Af Amer: 27 mL/min — ABNORMAL LOW (ref >60)
Glucose, Bld: 129 mg/dL — ABNORMAL HIGH (ref 70–99)
Potassium: 4.2 mmol/L (ref 3.5–5.1)
Sodium: 135 mmol/L (ref 135–145)

## 2019-01-26 LAB — PROCALCITONIN: Procalcitonin: 2.74 ng/mL

## 2019-01-26 MED ORDER — GENERIC EXTERNAL MEDICATION
Status: DC
Start: ? — End: 2019-01-26

## 2019-01-26 NOTE — Progress Notes (Addendum)
Pulmonary Critical Care Medicine [place]   PULMONARY CRITICAL CARE SERVICE  PROGRESS NOTE  Date of Service: 01/26/2019  Roger Nelson  Roger Nelson  DOB: [birth year]   DOA: 01/08/2019  Referring Physician: Merton Border, MD  HPI: Roger Nelson is a 78 y.o. male seen for follow up of Acute on Chronic Respiratory Failure.  Patient's condition remains guarded at this time.  Chest x-ray and sputum culture were ordered and patient is being transferred to negative pressure room at this time.  Remains on full support on the ventilator.  Patient continues to do poorly unlikely to survive  Medications: Reviewed on Rounds  Physical Exam:  Vitals: Pulse 66 respirations 14 BP 135/53 O2 sat 96% temp 96.0  Ventilator Settings ventilator mode AC PC rate of 12 expiratory pressure 12 PEEP of 10 FiO2 75%  . General: Comfortable at this time . Eyes: Grossly normal lids, irises & conjunctiva . ENT: grossly tongue is normal . Neck: no obvious mass . Cardiovascular: S1 S2 normal no gallop . Respiratory: Coarse breath sounds . Abdomen: soft . Skin: no rash seen on limited exam . Musculoskeletal: not rigid . Psychiatric:unable to assess . Neurologic: no seizure no involuntary movements         Lab Data:   Basic Metabolic Panel: Recent Labs  Lab 01/20/19 0558  01/23/19 0543 01/24/19 6945 01/25/19 0711 01/25/19 1135 01/25/19 2323  NA 126*   < > 131* 132* 131* 132* 134*  K 3.6   < > 4.9 4.5 5.0 4.3 4.2  CL 88*   < > 96* 96* 94* 99 99  CO2 23   < > 22 21* 20* 19* 21*  GLUCOSE 175*   < > 168* 146* 146* 118* 97  BUN 77*   < > 88* 101* 113* 118* 118*  CREATININE 1.62*   < > 1.57* 1.82* 2.08* 2.04* 2.22*  CALCIUM 8.4*   < > 8.3* 8.6* 8.5* 8.4* 8.4*  MG 2.3  --  2.5*  --  2.7*  --   --   PHOS 5.1*  --   --   --   --   --   --    < > = values in this interval not displayed.    ABG: Recent Labs  Lab 01/19/19 2236  PHART 7.433  PCO2ART 38.6  PO2ART 111*  HCO3 25.4   O2SAT 98.1    Liver Function Tests: Recent Labs  Lab 01/20/19 0558 01/21/19 0738 01/23/19 0543  AST  --  28 63*  ALT  --  29 23  ALKPHOS  --  122 135*  BILITOT  --  0.6 1.7*  PROT  --  4.9* 4.7*  ALBUMIN 1.6* 1.4* 1.6*   No results for input(s): LIPASE, AMYLASE in the last 168 hours. Recent Labs  Lab 01/25/19 1921  AMMONIA 39*    CBC: Recent Labs  Lab 01/23/19 0543 01/24/19 0388 01/25/19 0711 01/25/19 1820 01/26/19 0625  WBC 15.7* 24.2* 34.3* 29.0* 40.9*  HGB 8.2* 8.2* 6.8* 7.4* 7.0*  HCT 25.1* 26.2* 22.8* 24.1* 22.8*  MCV 95.8 97.0 100.0 95.6 98.7  PLT 336 242 230 177 204    Cardiac Enzymes: No results for input(s): CKTOTAL, CKMB, CKMBINDEX, TROPONINI in the last 168 hours.  BNP (last 3 results) Recent Labs    01/02/19 0613  BNP 604.8*    ProBNP (last 3 results) No results for input(s): PROBNP in the last 8760 hours.  Radiological Exams: US Renal  Result  Date: 01/24/2019 CLINICAL DATA:  Acute kidney injury. EXAM: RENAL / URINARY TRACT ULTRASOUND COMPLETE COMPARISON:  None. FINDINGS: Right Kidney: Renal measurements: 11.5 x 5.2 x 5.7 cm = volume: 179.9 mL . Echogenicity within normal limits. No solid mass or hydronephrosis visualized. 0.8 cm simple cyst noted. Left Kidney: Renal measurements: 13.5 x 6.2 x 6.5 cm = volume: 283.6 mL. Echogenicity within normal limits. No mass or hydronephrosis visualized. Bladder: Completely collapsed with a Foley catheter in place. IMPRESSION: Negative for hydronephrosis.  Negative exam. Electronically Signed   By: Inge Rise M.D.   On: 01/24/2019 15:53   Dg Chest Port 1 View  Result Date: 01/26/2019 CLINICAL DATA:  Pneumonia. Intubated. Former smoker. EXAM: PORTABLE CHEST 1 VIEW COMPARISON:  Chest x-rays dated 01/25/2019 and 01/22/2019. FINDINGS: Stable cardiomegaly. Tracheostomy tube remains in the midline with tip approximately 3 cm above the carina. No significant change of the bilateral perihilar airspace  opacities, perhaps minimally improved aeration. Probable small LEFT pleural effusion. No pneumothorax seen. IMPRESSION: 1. Bilateral perihilar airspace opacities, not significantly changed compared to yesterday's chest x-ray, perhaps minimally improved aeration in the bilateral perihilar regions which suggests slightly improved fluid status. 2. Stable cardiomegaly. 3. Probable small LEFT pleural effusion. Electronically Signed   By: Franki Cabot M.D.   On: 01/26/2019 13:29   Dg Chest Port 1 View  Result Date: 01/25/2019 CLINICAL DATA:  Chronic ventilator dependent respiratory failure. Follow-up pneumonia. EXAM: PORTABLE CHEST 1 VIEW COMPARISON:  01/22/2019 and earlier. FINDINGS: Tracheostomy tube tip in satisfactory position below the thoracic inlet. LEFT arm PICC tip difficult to visualize on the current examination. Cardiac silhouette moderately enlarged, unchanged. Dense airspace consolidation in the LEFT LOWER LOBE, confluent airspace consolidation in the BILATERAL UPPER lobes and patchy airspace opacities involving the RIGHT lung base, unchanged since the examination 3 days ago. No new abnormalities. IMPRESSION: 1. Stable diffuse BILATERAL pneumonia, most confluent in the LEFT LOWER LOBE. 2. No new abnormalities. Electronically Signed   By: Evangeline Dakin M.D.   On: 01/25/2019 09:26    Assessment/Plan Active Problems:   Acute on chronic respiratory failure with hypoxia (HCC)   Bacterial lobar pneumonia   Cervical myelopathy with cervical radiculopathy   Altered mental status   Chronic atrial fibrillation   1. Acute on chronic respiratory failure with hypoxia continue full support at this time.  We are awaiting sputum culture results.  Patient is in negative pressure room.  Continue pulmonary and secretion management 2. Bacterial lobar pneumonia treated continue to monitor 3. Cervical myelopathy unchanged continue supportive care 4. Altered mental status unchanged 5. Chronic A. fib  relation rate controlled   I have personally seen and evaluated the patient, evaluated laboratory and imaging results, formulated the assessment and plan and placed orders. The Patient requires high complexity decision making for assessment and support.  Case was discussed on Rounds with the Respiratory Therapy Staff  Allyne Gee, MD [facility] Pulmonary Critical Care Medicine Sleep Medicine

## 2019-01-27 ENCOUNTER — Other Ambulatory Visit (HOSPITAL_COMMUNITY): Payer: Self-pay

## 2019-01-27 DIAGNOSIS — J159 Unspecified bacterial pneumonia: Secondary | ICD-10-CM | POA: Diagnosis not present

## 2019-01-27 DIAGNOSIS — J9690 Respiratory failure, unspecified, unspecified whether with hypoxia or hypercapnia: Secondary | ICD-10-CM | POA: Diagnosis present

## 2019-01-27 DIAGNOSIS — J9621 Acute and chronic respiratory failure with hypoxia: Secondary | ICD-10-CM | POA: Diagnosis not present

## 2019-01-27 DIAGNOSIS — M4712 Other spondylosis with myelopathy, cervical region: Secondary | ICD-10-CM | POA: Diagnosis not present

## 2019-01-27 DIAGNOSIS — R4182 Altered mental status, unspecified: Secondary | ICD-10-CM | POA: Diagnosis not present

## 2019-01-27 LAB — BASIC METABOLIC PANEL
Anion gap: 17 — ABNORMAL HIGH (ref 5–15)
BUN: 140 mg/dL — ABNORMAL HIGH (ref 8–23)
CO2: 19 mmol/L — ABNORMAL LOW (ref 22–32)
Calcium: 8.2 mg/dL — ABNORMAL LOW (ref 8.9–10.3)
Chloride: 100 mmol/L (ref 98–111)
Creatinine, Ser: 2.63 mg/dL — ABNORMAL HIGH (ref 0.61–1.24)
GFR calc Af Amer: 26 mL/min — ABNORMAL LOW (ref >60)
GFR calc non Af Amer: 23 mL/min — ABNORMAL LOW (ref >60)
Glucose, Bld: 127 mg/dL — ABNORMAL HIGH (ref 70–99)
Potassium: 4.1 mmol/L (ref 3.5–5.1)
Sodium: 136 mmol/L (ref 135–145)

## 2019-01-27 LAB — BLOOD GAS, ARTERIAL
Acid-base deficit: 11.7 mmol/L — ABNORMAL HIGH (ref 0.0–2.0)
Acid-base deficit: 10.4 mmol/L — ABNORMAL HIGH (ref 0.0–2.0)
Bicarbonate: 18.7 mmol/L — ABNORMAL LOW (ref 20.0–28.0)
Bicarbonate: 18.8 mmol/L — ABNORMAL LOW (ref 20.0–28.0)
FIO2: 75
FIO2: 75
O2 Saturation: 95.2 %
O2 Saturation: 93.3 %
PEEP: 10 cmH2O
PEEP: 10 cmH2O
Patient temperature: 98.6
Patient temperature: 97
Pressure control: 12 cmH2O
Pressure control: 16 cmH2O
RATE: 12 resp/min
RATE: 16 resp/min
pCO2 arterial: 87.7 mmHg (ref 32.0–48.0)
pCO2 arterial: 70 mmHg (ref 32.0–48.0)
pH, Arterial: 6.96 — CL (ref 7.350–7.450)
pH, Arterial: 7.049 — CL (ref 7.350–7.450)
pO2, Arterial: 95.6 mmHg (ref 83.0–108.0)
pO2, Arterial: 78.6 mmHg — ABNORMAL LOW (ref 83.0–108.0)

## 2019-01-27 LAB — PROCALCITONIN
Procalcitonin: 2.58 ng/mL
Procalcitonin: 2.38 ng/mL

## 2019-01-27 LAB — BPAM RBC
Blood Product Expiration Date: 202004152359
Blood Product Expiration Date: 202004242359
ISSUE DATE / TIME: 202004111310
ISSUE DATE / TIME: 202004121657
Unit Type and Rh: 5100
Unit Type and Rh: 9500

## 2019-01-27 LAB — TYPE AND SCREEN
ABO/RH(D): O POS
Antibody Screen: NEGATIVE
Unit division: 0
Unit division: 0

## 2019-01-27 LAB — COMPREHENSIVE METABOLIC PANEL
ALT: 32 U/L (ref 0–44)
AST: 30 U/L (ref 15–41)
Albumin: 2.3 g/dL — ABNORMAL LOW (ref 3.5–5.0)
Alkaline Phosphatase: 148 U/L — ABNORMAL HIGH (ref 38–126)
Anion gap: 18 — ABNORMAL HIGH (ref 5–15)
BUN: 136 mg/dL — ABNORMAL HIGH (ref 8–23)
CO2: 20 mmol/L — ABNORMAL LOW (ref 22–32)
Calcium: 8.6 mg/dL — ABNORMAL LOW (ref 8.9–10.3)
Chloride: 99 mmol/L (ref 98–111)
Creatinine, Ser: 2.62 mg/dL — ABNORMAL HIGH (ref 0.61–1.24)
GFR calc Af Amer: 27 mL/min — ABNORMAL LOW (ref >60)
GFR calc non Af Amer: 23 mL/min — ABNORMAL LOW (ref >60)
Glucose, Bld: 118 mg/dL — ABNORMAL HIGH (ref 70–99)
Potassium: 4.4 mmol/L (ref 3.5–5.1)
Sodium: 137 mmol/L (ref 135–145)
Total Bilirubin: 0.7 mg/dL (ref 0.3–1.2)
Total Protein: 4.6 g/dL — ABNORMAL LOW (ref 6.5–8.1)

## 2019-01-27 LAB — MAGNESIUM: Magnesium: 2.9 mg/dL — ABNORMAL HIGH (ref 1.7–2.4)

## 2019-01-27 LAB — FERRITIN: Ferritin: 808 ng/mL — ABNORMAL HIGH (ref 24–336)

## 2019-01-27 LAB — CBC
HCT: 24.8 % — ABNORMAL LOW (ref 39.0–52.0)
Hemoglobin: 7.7 g/dL — ABNORMAL LOW (ref 13.0–17.0)
MCH: 30 pg (ref 26.0–34.0)
MCHC: 31 g/dL (ref 30.0–36.0)
MCV: 96.5 fL (ref 80.0–100.0)
Platelets: 137 10*3/uL — ABNORMAL LOW (ref 150–400)
RBC: 2.57 MIL/uL — ABNORMAL LOW (ref 4.22–5.81)
RDW: 18.9 % — ABNORMAL HIGH (ref 11.5–15.5)
WBC: 28.2 10*3/uL — ABNORMAL HIGH (ref 4.0–10.5)
nRBC: 0.9 % — ABNORMAL HIGH (ref 0.0–0.2)

## 2019-01-27 LAB — SARS CORONAVIRUS 2 BY RT PCR (HOSPITAL ORDER, PERFORMED IN ~~LOC~~ HOSPITAL LAB): SARS Coronavirus 2: NEGATIVE

## 2019-01-27 LAB — LACTIC ACID, PLASMA: Lactic Acid, Venous: 4 mmol/L (ref 0.5–1.9)

## 2019-01-27 LAB — CK: Total CK: 84 U/L (ref 49–397)

## 2019-01-27 NOTE — Consult Note (Signed)
[facility] CONSULT NOTE    Date: 01/27/2019                  Patient Name:  Roger Nelson  MRN: [id number]  DOB: 23-Feb-1941  Age / Sex: 78 y.o., male         PCP: Iva Lento, PA-C                 Service Requesting Consult:  Hospitalist                 Reason for Consult:  Evaluation management of acute renal failure.            History of Present Illness: Patient is a 78 y.o. male with a PMHx of atrial fibrillation, right eye melanoma, chronic back pain, depression, diabetes mellitus type 2, GERD, hypertension, obstructive sleep apnea, who was admitted to [facility] on 01/14/2019 for on going treatment of acute respiratory failure.  At outside hospital patient had a cervical stenosis myelopathy and is status post anterior cervical corpectomy C3-4-5-6 decompression and fusion which was done in February.  His postoperative course was complicated by acute respiratory failure and is status post tracheostomy and PEG tube placement.  He was subsequently transferred here.  Patient noted to have significant azotemia with a BUN of 136 and creatinine of 2.6 now.  Patient has developed worsening acute renal failure gradually over the course of the hospitalization.  He is not making very much urine.  He has been on varying doses of diuretics which at the moment are on hold.  We had a long discussion with the patient's wife and she is agreeable to performing renal replacement therapy as needed.   Medications: Outpatient medications: Medications Prior to Admission  Medication Sig Dispense Refill Last Dose  . alendronate (FOSAMAX) 70 MG tablet Take 70 mg by mouth once a week.    Taking  . amLODipine (NORVASC) 5 MG tablet Take 5 mg by mouth daily.    Taking  . apixaban (ELIQUIS) 5 MG TABS tablet Take 5 mg by mouth 2 (two) times daily.    Taking  . benazepril (LOTENSIN) 40 MG tablet Take 40 mg by mouth daily.   Taking  . Calcium Carb-Cholecalciferol 600-200 MG-UNIT TABS Take 2  tablets by mouth daily.   Taking  . FLUoxetine (PROZAC) 20 MG tablet Take 20 mg by mouth daily.   Taking  . furosemide (LASIX) 20 MG tablet Take 20 mg by mouth daily.    Taking  . Magnesium 200 MG TABS Take 400 mg by mouth daily.   Taking  . metFORMIN (GLUCOPHAGE-XR) 500 MG 24 hr tablet Take 500 mg by mouth daily.  0 Taking  . metoprolol succinate (TOPROL-XL) 25 MG 24 hr tablet Take 25 mg by mouth 2 (two) times daily.    Taking  . oxyCODONE (OXY IR/ROXICODONE) 5 MG immediate release tablet TAKE 1 TABLET BY MOUTH 4 TIMES DAILY AS NEEDED  0 Taking  . pravastatin (PRAVACHOL) 40 MG tablet Take 40 mg by mouth daily.  0 Taking    Current medications: Albumin 12.5 g IV 3 times daily, fluconazole 200 mg daily, meropenem 1 g IV every 8 hours, sodium bicarbonate drip, Humalog insulin, amiodarone 200 mg twice daily, azithromycin 250 mg daily, diltiazem 60 mg every 6 hours, fluoxetine 20 mg daily, melatonin 3 mg nightly, methylprednisolone 60 mg IV every 8 hours, MiraLAX 17 g daily, pro-stat 30 cc 3 times daily, Protonix 40 mg twice daily, multivitamin  1 tablet daily, vitamin C 500 mg twice daily, Zyvox 600 mg IV every 12 hours  Allergies: No Known Allergies    Past Medical History: Past Medical History:  Diagnosis Date  . A-fib ([place])   . Acute on chronic respiratory failure with hypoxia ([place])   . Altered mental status   . Anxiety    having panic attacks awhile ago  . Bacterial lobar pneumonia   . Cancer ([place])    melanoma in his eye right--wears prosthetic  . Cervical myelopathy with cervical radiculopathy   . Chronic atrial fibrillation   . Chronic back pain   . Coronary artery disease    found out about it 6-7 yrs ago. 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 Depression   . Diabetes mellitus without complication (HCC)    borderline  . Dysrhythmia    palpitations caused by HCTZ  . GERD (gastroesophageal reflux disease)    takes OTC every now and then  . Headache    h/o migraines--last one was 6  mths  . Heart disease   . Hypertension   . Leg weakness   . PONV (postoperative nausea and vomiting)   . Sleep apnea    tested about 2 yrs ago. has bipap     Past Surgical History: Past Surgical History:  Procedure Laterality Date  . APPENDECTOMY    . CHOLECYSTECTOMY    . colon was twisted     medically treated  . EYE SURGERY     right eye removed d/t cancer  . FRACTURE SURGERY     right arm  . HERNIA REPAIR     umbilical  . JOINT REPLACEMENT     bilateral hips  . SHOULDER SURGERY    . SPINAL FUSION  12-2014   Dr. Hal Neer  . SPINAL FUSION  2018   Dr. Owens Shark  . TONSILLECTOMY       Family History: Family History  Problem Relation Age of Onset  . Heart disease Father   . Lung cancer Father   . Hypertension Mother      Social History: Social History   Socioeconomic History  . Marital status: Married    Spouse name: Not on file  . Number of children: 3  . Years of education: [employer]  . Highest education level: Not on file  Occupational History  . Occupation: Retired  Scientific laboratory technician  . Financial resource strain: Not on file  . Food insecurity:    Worry: Not on file    Inability: Not on file  . Transportation needs:    Medical: Not on file    Non-medical: Not on file  Tobacco Use  . Smoking status: Former Smoker    Types: Cigarettes  . Smokeless tobacco: Never Used  . Tobacco comment: quit at age 72  Substance and Sexual Activity  . Alcohol use: No  . Drug use: No  . Sexual activity: Not on file  Lifestyle  . Physical activity:    Days per week: Not on file    Minutes per session: Not on file  . Stress: Not on file  Relationships  . Social connections:    Talks on phone: Not on file    Gets together: Not on file    Attends religious service: Not on file    Active member of club or organization: Not on file    Attends meetings of clubs or organizations: Not on file    Relationship status: Not on file  . Intimate  partner violence:    Fear of  current or ex partner: Not on file    Emotionally abused: Not on file    Physically abused: Not on file    Forced sexual activity: Not on file  Other Topics Concern  . Not on file  Social History Narrative   Lives at home with his wife.   Right-handed.   Caffeine use:  2 cups coffee, 3 cups tea daily.     Review of Systems: Patient unable to offer as he has a tracheostomy in place and has decreased mental status  Vital Signs: Temperature 96 pulse 65 respirations 14 blood pressure 98/48 Weight trends: There were no vitals filed for this visit.  Physical Exam: General: Critically ill appearing  Head: Normocephalic, atraumatic.  Eyes: Anicteric, EOMI  Nose: Mucous membranes moist, not inflammed, nonerythematous.  Throat: Oropharynx nonerythematous, no exudate appreciated.   Neck: Tracheostomy in place  Lungs:  Normal respiratory effort. Bilateral rhonchi.  Heart: S1S2 irregular  Abdomen:  BS normoactive. Soft, Nondistended, non-tender.  No masses or organomegaly.  Extremities: 4+ edema in all 4 extremeties  Neurologic: Lethargic, hard to arouse  Skin: Weeping skin in upper and lower extremeties    Lab results: Basic Metabolic Panel: Recent Labs  Lab 01/23/19 0543  01/25/19 0711  01/25/19 2323 01/26/19 1645 01/27/19 0654  NA 131*   < > 131*   < > 134* 135 137  K 4.9   < > 5.0   < > 4.2 4.2 4.4  CL 96*   < > 94*   < > 99 100 99  CO2 22   < > 20*   < > 21* 22 20*  GLUCOSE 168*   < > 146*   < > 97 129* 118*  BUN 88*   < > 113*   < > 118* 128* 136*  CREATININE 1.57*   < > 2.08*   < > 2.22* 2.31* 2.62*  CALCIUM 8.3*   < > 8.5*   < > 8.4* 8.6* 8.6*  MG 2.5*  --  2.7*  --   --   --  2.9*   < > = values in this interval not displayed.    Liver Function Tests: Recent Labs  Lab 01/21/19 0738 01/23/19 0543 01/27/19 0654  AST 28 63* 30  ALT 29 23 32  ALKPHOS 122 135* 148*  BILITOT 0.6 1.7* 0.7  PROT 4.9* 4.7* 4.6*  ALBUMIN 1.4* 1.6* 2.3*   No results for  input(s): LIPASE, AMYLASE in the last 168 hours. Recent Labs  Lab 01/25/19 1921  AMMONIA 39*    CBC: Recent Labs  Lab 01/24/19 0608 01/25/19 0711 01/25/19 1820 01/26/19 0625 01/27/19 0654  WBC 24.2* 34.3* 29.0* 40.9* 28.2*  HGB 8.2* 6.8* 7.4* 7.0* 7.7*  HCT 26.2* 22.8* 24.1* 22.8* 24.8*  MCV 97.0 100.0 95.6 98.7 96.5  PLT 242 230 177 204 137*    Cardiac Enzymes: Recent Labs  Lab 01/27/19 0654  CKTOTAL 84    BNP: Invalid input(s): POCBNP  CBG: No results for input(s): GLUCAP in the last 168 hours.  Microbiology: Results for orders placed or performed during the hospital encounter of 01/10/2019  Culture, respiratory (non-expectorated)     Status: None   Collection Time: 12/17/2018  6:42 PM  Result Value Ref Range Status   Specimen Description TRACHEAL ASPIRATE  Final   Special Requests NONE  Final   Gram Stain   Final    MODERATE WBC PRESENT, PREDOMINANTLY PMN  FEW GRAM NEGATIVE RODS Performed at [facility], [place] [address], [place], [place] [address]    Culture   Final    FEW KLEBSIELLA PNEUMONIAE FEW ENTEROBACTER CLOACAE    Report Status 01/06/2019 FINAL  Final   Organism ID, Bacteria KLEBSIELLA PNEUMONIAE  Final   Organism ID, Bacteria ENTEROBACTER CLOACAE  Final      Susceptibility   Enterobacter cloacae - MIC*    CEFAZOLIN >=64 RESISTANT Resistant     CEFEPIME 4 SENSITIVE Sensitive     CEFTAZIDIME >=64 RESISTANT Resistant     CEFTRIAXONE >=64 RESISTANT Resistant     CIPROFLOXACIN <=0.25 SENSITIVE Sensitive     GENTAMICIN <=1 SENSITIVE Sensitive     IMIPENEM 0.5 SENSITIVE Sensitive     TRIMETH/SULFA <=20 SENSITIVE Sensitive     PIP/TAZO >=128 RESISTANT Resistant     * FEW ENTEROBACTER CLOACAE   Klebsiella pneumoniae - MIC*    AMPICILLIN >=32 RESISTANT Resistant     CEFAZOLIN >=64 RESISTANT Resistant     CEFEPIME <=1 SENSITIVE Sensitive     CEFTAZIDIME 32 RESISTANT Resistant     CEFTRIAXONE <=1 SENSITIVE Sensitive     CIPROFLOXACIN <=0.25  SENSITIVE Sensitive     GENTAMICIN <=1 SENSITIVE Sensitive     IMIPENEM <=0.25 SENSITIVE Sensitive     TRIMETH/SULFA <=20 SENSITIVE Sensitive     AMPICILLIN/SULBACTAM RESISTANT Resistant     PIP/TAZO >=128 RESISTANT Resistant     Extended ESBL NEGATIVE Sensitive     * FEW KLEBSIELLA PNEUMONIAE  Culture, respiratory (non-expectorated)     Status: None   Collection Time: 01/12/19 11:03 AM  Result Value Ref Range Status   Specimen Description TRACHEAL ASPIRATE  Final   Special Requests NONE  Final   Gram Stain   Final    ABUNDANT WBC PRESENT,BOTH PMN AND MONONUCLEAR RARE SQUAMOUS EPITHELIAL CELLS PRESENT MODERATE GRAM NEGATIVE RODS FEW GRAM POSITIVE COCCI IN PAIRS FEW GRAM POSITIVE RODS Performed at [facility], [place] [address] [place], [place] [address]    Culture   Final    MODERATE ENTEROBACTER CLOACAE MODERATE KLEBSIELLA PNEUMONIAE MODERATE METHICILLIN RESISTANT STAPHYLOCOCCUS AUREUS    Report Status 01/15/2019 FINAL  Final   Organism ID, Bacteria ENTEROBACTER CLOACAE  Final   Organism ID, Bacteria KLEBSIELLA PNEUMONIAE  Final   Organism ID, Bacteria METHICILLIN RESISTANT STAPHYLOCOCCUS AUREUS  Final      Susceptibility   Enterobacter cloacae - MIC*    CEFAZOLIN >=64 RESISTANT Resistant     CEFEPIME 2 SENSITIVE Sensitive     CEFTAZIDIME >=64 RESISTANT Resistant     CEFTRIAXONE >=64 RESISTANT Resistant     CIPROFLOXACIN <=0.25 SENSITIVE Sensitive     GENTAMICIN <=1 SENSITIVE Sensitive     IMIPENEM <=0.25 SENSITIVE Sensitive     TRIMETH/SULFA <=20 SENSITIVE Sensitive     PIP/TAZO >=128 RESISTANT Resistant     * MODERATE ENTEROBACTER CLOACAE   Klebsiella pneumoniae - MIC*    AMPICILLIN >=32 RESISTANT Resistant     CEFAZOLIN <=4 SENSITIVE Sensitive     CEFEPIME <=1 SENSITIVE Sensitive     CEFTAZIDIME <=1 SENSITIVE Sensitive     CEFTRIAXONE <=1 SENSITIVE Sensitive     CIPROFLOXACIN <=0.25 SENSITIVE Sensitive     GENTAMICIN <=1 SENSITIVE Sensitive     IMIPENEM  <=0.25 SENSITIVE Sensitive     TRIMETH/SULFA <=20 SENSITIVE Sensitive     AMPICILLIN/SULBACTAM 8 SENSITIVE Sensitive     PIP/TAZO <=4 SENSITIVE Sensitive     Extended ESBL NEGATIVE Sensitive     *  MODERATE KLEBSIELLA PNEUMONIAE   Methicillin resistant staphylococcus aureus - MIC*    CIPROFLOXACIN <=0.5 SENSITIVE Sensitive     ERYTHROMYCIN 0.5 SENSITIVE Sensitive     GENTAMICIN <=0.5 SENSITIVE Sensitive     OXACILLIN >=4 RESISTANT Resistant     TETRACYCLINE <=1 SENSITIVE Sensitive     VANCOMYCIN <=0.5 SENSITIVE Sensitive     TRIMETH/SULFA <=10 SENSITIVE Sensitive     CLINDAMYCIN <=0.25 SENSITIVE Sensitive     RIFAMPIN <=0.5 SENSITIVE Sensitive     Inducible Clindamycin NEGATIVE Sensitive     * MODERATE METHICILLIN RESISTANT STAPHYLOCOCCUS AUREUS  Culture, Urine     Status: None   Collection Time: 01/18/19  7:55 AM  Result Value Ref Range Status   Specimen Description URINE, RANDOM  Final   Special Requests NONE  Final   Culture   Final    NO GROWTH Performed at [facility], [place] [address], [place], [place] [address]    Report Status 01/19/2019 FINAL  Final  Culture, respiratory     Status: None   Collection Time: 01/18/19  7:57 AM  Result Value Ref Range Status   Specimen Description TRACHEAL ASPIRATE  Final   Special Requests NONE  Final   Gram Stain   Final    FEW WBC PRESENT, PREDOMINANTLY PMN MODERATE BUDDING YEAST SEEN Performed at [facility], [place] [address], [place], [place] [address]    Culture FEW CANDIDA LUSITANIAE  Final   Report Status 01/20/2019 FINAL  Final  Culture, blood (routine x 2)     Status: None   Collection Time: 01/18/19  8:43 AM  Result Value Ref Range Status   Specimen Description BLOOD LEFT HAND  Final   Special Requests   Final    BOTTLES DRAWN AEROBIC ONLY Blood Culture results may not be optimal due to an inadequate volume of blood received in culture bottles   Culture   Final    NO GROWTH 5 DAYS Performed at [facility], [place] [address] [place], [place] [address]    Report Status 01/23/2019 FINAL  Final  Culture, blood (routine x 2)     Status: Abnormal   Collection Time: 01/18/19  8:52 AM  Result Value Ref Range Status   Specimen Description BLOOD LEFT HAND  Final   Special Requests   Final    BOTTLES DRAWN AEROBIC ONLY Blood Culture results may not be optimal due to an inadequate volume of blood received in culture bottles   Culture  Setup Time   Final    GRAM POSITIVE COCCI AEROBIC BOTTLE ONLY CRITICAL RESULT CALLED TO, READ BACK BY AND VERIFIED WITH: Furigay RN 11:20 01/20/19 ([email or web address])    Culture (A)  Final    STAPHYLOCOCCUS SPECIES (COAGULASE NEGATIVE) THE SIGNIFICANCE OF ISOLATING THIS ORGANISM FROM A SINGLE SET OF BLOOD CULTURES WHEN MULTIPLE SETS ARE DRAWN IS UNCERTAIN. PLEASE NOTIFY THE MICROBIOLOGY DEPARTMENT WITHIN ONE WEEK IF SPECIATION AND SENSITIVITIES ARE REQUIRED. Performed at [facility], [place] [address] [place], [place] [address]    Report Status 01/22/2019 FINAL  Final  Blood Culture ID Panel (Reflexed)     Status: Abnormal   Collection Time: 01/18/19  8:52 AM  Result Value Ref Range Status   Enterococcus species NOT DETECTED NOT DETECTED Final   Listeria monocytogenes NOT DETECTED NOT DETECTED Final   Staphylococcus species DETECTED (A) NOT DETECTED Final    Comment: Methicillin (oxacillin) resistant coagulase negative staphylococcus. Possible blood culture contaminant (unless isolated  from more than one blood culture draw or clinical case suggests pathogenicity). No antibiotic treatment is indicated for blood  culture contaminants. CRITICAL RESULT CALLED TO, READ BACK BY AND VERIFIED WITH: Furigay RN 11:20 01/20/19 ([email or web address])    Staphylococcus aureus (BCID) NOT DETECTED NOT DETECTED Final   Methicillin resistance DETECTED (A) NOT DETECTED Final    Comment: CRITICAL RESULT CALLED TO, READ BACK BY AND VERIFIED WITH: Furigay RN 11:20 01/20/19 ([email or web address])    Streptococcus  species NOT DETECTED NOT DETECTED Final   Streptococcus agalactiae NOT DETECTED NOT DETECTED Final   Streptococcus pneumoniae NOT DETECTED NOT DETECTED Final   Streptococcus pyogenes NOT DETECTED NOT DETECTED Final   Acinetobacter baumannii NOT DETECTED NOT DETECTED Final   Enterobacteriaceae species NOT DETECTED NOT DETECTED Final   Enterobacter cloacae complex NOT DETECTED NOT DETECTED Final   Escherichia coli NOT DETECTED NOT DETECTED Final   Klebsiella oxytoca NOT DETECTED NOT DETECTED Final   Klebsiella pneumoniae NOT DETECTED NOT DETECTED Final   Proteus species NOT DETECTED NOT DETECTED Final   Serratia marcescens NOT DETECTED NOT DETECTED Final   Haemophilus influenzae NOT DETECTED NOT DETECTED Final   Neisseria meningitidis NOT DETECTED NOT DETECTED Final   Pseudomonas aeruginosa NOT DETECTED NOT DETECTED Final   Candida albicans NOT DETECTED NOT DETECTED Final   Candida glabrata NOT DETECTED NOT DETECTED Final   Candida krusei NOT DETECTED NOT DETECTED Final   Candida parapsilosis NOT DETECTED NOT DETECTED Final   Candida tropicalis NOT DETECTED NOT DETECTED Final    Comment: Performed at [facility], [address] [place], [place] [address]  Culture, blood (routine x 2)     Status: None (Preliminary result)   Collection Time: 01/25/19  7:15 PM  Result Value Ref Range Status   Specimen Description BLOOD RIGHT ARM  Final   Special Requests   Final    BOTTLES DRAWN AEROBIC ONLY Blood Culture adequate volume   Culture   Final    NO GROWTH 2 DAYS Performed at [facility], [place] [address] [place], [place] [address]    Report Status PENDING  Incomplete  Culture, blood (routine x 2)     Status: None (Preliminary result)   Collection Time: 01/25/19  7:20 PM  Result Value Ref Range Status   Specimen Description BLOOD RIGHT HAND  Final   Special Requests   Final    BOTTLES DRAWN AEROBIC AND ANAEROBIC Blood Culture adequate volume   Culture   Final    NO GROWTH 2  DAYS Performed at [facility], [place] [address] [place], [place] [address]    Report Status PENDING  Incomplete  Respiratory Panel by PCR     Status: None   Collection Time: 01/26/19 12:28 PM  Result Value Ref Range Status   Adenovirus NOT DETECTED NOT DETECTED Final   Coronavirus 229E NOT DETECTED NOT DETECTED Final    Comment: (NOTE) The Coronavirus on the Respiratory Panel, DOES NOT test for the novel  Coronavirus (2019 nCoV)    Coronavirus HKU1 NOT DETECTED NOT DETECTED Final   Coronavirus NL63 NOT DETECTED NOT DETECTED Final   Coronavirus OC43 NOT DETECTED NOT DETECTED Final   Metapneumovirus NOT DETECTED NOT DETECTED Final   Rhinovirus / Enterovirus NOT DETECTED NOT DETECTED Final   Influenza A NOT DETECTED NOT DETECTED Final   Influenza B NOT DETECTED NOT DETECTED Final   Parainfluenza Virus 1 NOT DETECTED NOT DETECTED Final   Parainfluenza Virus 2 NOT DETECTED NOT  DETECTED Final   Parainfluenza Virus 3 NOT DETECTED NOT DETECTED Final   Parainfluenza Virus 4 NOT DETECTED NOT DETECTED Final   Respiratory Syncytial Virus NOT DETECTED NOT DETECTED Final   Bordetella pertussis NOT DETECTED NOT DETECTED Final   Chlamydophila pneumoniae NOT DETECTED NOT DETECTED Final   Mycoplasma pneumoniae NOT DETECTED NOT DETECTED Final    Comment: Performed at [facility], [place] [address] [place], [place] [address]    Coagulation Studies: No results for input(s): LABPROT, INR in the last 72 hours.  Urinalysis: No results for input(s): COLORURINE, LABSPEC, PHURINE, GLUCOSEU, HGBUR, BILIRUBINUR, KETONESUR, PROTEINUR, UROBILINOGEN, NITRITE, LEUKOCYTESUR in the last 72 hours.  Invalid input(s): APPERANCEUR    Imaging: Dg Chest Port 1 View  Result Date: 01/26/2019 CLINICAL DATA:  Pneumonia. Intubated. Former smoker. EXAM: PORTABLE CHEST 1 VIEW COMPARISON:  Chest x-rays dated 01/25/2019 and 01/22/2019. FINDINGS: Stable cardiomegaly. Tracheostomy tube remains in the midline with  tip approximately 3 cm above the carina. No significant change of the bilateral perihilar airspace opacities, perhaps minimally improved aeration. Probable small LEFT pleural effusion. No pneumothorax seen. IMPRESSION: 1. Bilateral perihilar airspace opacities, not significantly changed compared to yesterday's chest x-ray, perhaps minimally improved aeration in the bilateral perihilar regions which suggests slightly improved fluid status. 2. Stable cardiomegaly. 3. Probable small LEFT pleural effusion. Electronically Signed   By: Franki Cabot M.D.   On: 01/26/2019 13:29      Assessment & Plan: Pt is a 78 y.o. male with a PMHx of atrial fibrillation, right eye melanoma, chronic back pain, depression, diabetes mellitus type 2, GERD, hypertension, obstructive sleep apnea, who was admitted to [facility] on 01/10/2019 for on going treatment of acute respiratory failure.  1.  Acute renal failure, suspect multifactorial with contributions from diuretic usage. 2.  Acute respiratory failure. 3.  Metabolic acidosis. 4.  Anemia unspecified.  Plan: Patient found to be critically ill at this time.  His BUN is up to 136 with a creatinine of 2.6.  He is producing very little urine.  Case discussed with the patient's wife.  She is agreeable to proceeding with initiation of renal replacement therapy after discussion of risks, benefits, and alternatives.  We will request interventional radiology to place a temporary hemodialysis catheter as the patient is on multiple antibiotics at this time.  We will plan for first dialysis treatment tomorrow and thereafter on Thursday.  However overall prognosis is quite guarded on this patient.  Further plan as patient progresses.

## 2019-01-27 NOTE — Progress Notes (Addendum)
Pulmonary Critical Care Medicine [place]   PULMONARY CRITICAL CARE SERVICE  PROGRESS NOTE  Date of Service: 01/27/2019  Roger Nelson  SVX:[id number]  DOB: Feb 18, 1941   DOA: 12/25/2018  Referring Physician: Merton Border, MD  HPI: Roger Nelson is a 78 y.o. male seen for follow up of Acute on Chronic Respiratory Failure.  Patient remains on full support on the ventilator at this time.  Wife is at bedside and she would like to see the viral cultures come back before she makes a decision about comfort care.  She does understand that the patient's prognosis is guarded and that he is unlikely to survive.  Medications: Reviewed on Rounds  Physical Exam:  Vitals: Pulse 65 respirations 14 BP 98/48 O2 sat 95% temp 96.4  Ventilator Settings ventilator mode AC PC rate of 12 IP 12 PEEP of 10 FiO2 80%  . General: Comfortable at this time . Eyes: Grossly normal lids, irises & conjunctiva . ENT: grossly tongue is normal . Neck: no obvious mass . Cardiovascular: S1 S2 normal no gallop . Respiratory: Coarse breath sounds . Abdomen: soft . Skin: no rash seen on limited exam . Musculoskeletal: not rigid . Psychiatric:unable to assess . Neurologic: no seizure no involuntary movements         Lab Data:   Basic Metabolic Panel: Recent Labs  Lab 01/23/19 0543  01/25/19 0711 01/25/19 1135 01/25/19 2323 01/26/19 1645 01/27/19 0654  NA 131*   < > 131* 132* 134* 135 137  K 4.9   < > 5.0 4.3 4.2 4.2 4.4  CL 96*   < > 94* 99 99 100 99  CO2 22   < > 20* 19* 21* 22 20*  GLUCOSE 168*   < > 146* 118* 97 129* 118*  BUN 88*   < > 113* 118* 118* 128* 136*  CREATININE 1.57*   < > 2.08* 2.04* 2.22* 2.31* 2.62*  CALCIUM 8.3*   < > 8.5* 8.4* 8.4* 8.6* 8.6*  MG 2.5*  --  2.7*  --   --   --  2.9*   < > = values in this interval not displayed.    ABG: Recent Labs  Lab 01/27/19 0417 01/27/19 0650  PHART 6.960* 7.049*  PCO2ART 87.7* 70.0*  PO2ART 95.6 78.6*  HCO3 18.7* 18.8*   O2SAT 95.2 93.3    Liver Function Tests: Recent Labs  Lab 01/21/19 0738 01/23/19 0543 01/27/19 0654  AST 28 63* 30  ALT 29 23 32  ALKPHOS 122 135* 148*  BILITOT 0.6 1.7* 0.7  PROT 4.9* 4.7* 4.6*  ALBUMIN 1.4* 1.6* 2.3*   No results for input(s): LIPASE, AMYLASE in the last 168 hours. Recent Labs  Lab 01/25/19 1921  AMMONIA 39*    CBC: Recent Labs  Lab 01/24/19 0608 01/25/19 0711 01/25/19 1820 01/26/19 0625 01/27/19 0654  WBC 24.2* 34.3* 29.0* 40.9* 28.2*  HGB 8.2* 6.8* 7.4* 7.0* 7.7*  HCT 26.2* 22.8* 24.1* 22.8* 24.8*  MCV 97.0 100.0 95.6 98.7 96.5  PLT 242 230 177 204 137*    Cardiac Enzymes: Recent Labs  Lab 01/27/19 0654  CKTOTAL 84    BNP (last 3 results) Recent Labs    01/02/19 0613  BNP 604.8*    ProBNP (last 3 results) No results for input(s): PROBNP in the last 8760 hours.  Radiological Exams: Dg Chest Port 1 View  Result Date: 01/26/2019 CLINICAL DATA:  Pneumonia. Intubated. Former smoker. EXAM: PORTABLE CHEST 1 VIEW COMPARISON:  Chest x-rays dated 01/25/2019 and 01/22/2019. FINDINGS: Stable cardiomegaly. Tracheostomy tube remains in the midline with tip approximately 3 cm above the carina. No significant change of the bilateral perihilar airspace opacities, perhaps minimally improved aeration. Probable small LEFT pleural effusion. No pneumothorax seen. IMPRESSION: 1. Bilateral perihilar airspace opacities, not significantly changed compared to yesterday's chest x-ray, perhaps minimally improved aeration in the bilateral perihilar regions which suggests slightly improved fluid status. 2. Stable cardiomegaly. 3. Probable small LEFT pleural effusion. Electronically Signed   By: Franki Cabot M.D.   On: 01/26/2019 13:29    Assessment/Plan Active Problems:   Acute on chronic respiratory failure with hypoxia (HCC)   Bacterial lobar pneumonia   Cervical myelopathy with cervical radiculopathy   Altered mental status   Chronic atrial  fibrillation   1. Acute on chronic respiratory failure with hypoxia continue full support at this time.  Continue pulmonary toilet and secretion management.  Discussed comfort care with patient's wife and she wishes to wait until viral cultures have resulted before making a decision. 2. Bacterial lobar pneumonia treated continue to monitor 3. Cervical myelopathy unchanged continue supportive care 4. Altered mental status unchanged 5. Chronic atrial fibrillation rate controlled   I have personally seen and evaluated the patient, evaluated laboratory and imaging results, formulated the assessment and plan and placed orders. The Patient requires high complexity decision making for assessment and support.  Case was discussed on Rounds with the Respiratory Therapy Staff  Allyne Gee, MD [facility] Pulmonary Critical Care Medicine Sleep Medicine

## 2019-01-28 ENCOUNTER — Encounter (HOSPITAL_COMMUNITY): Payer: Self-pay | Admitting: Radiology

## 2019-01-28 ENCOUNTER — Other Ambulatory Visit (HOSPITAL_COMMUNITY): Payer: Self-pay

## 2019-01-28 DIAGNOSIS — J9621 Acute and chronic respiratory failure with hypoxia: Secondary | ICD-10-CM | POA: Diagnosis not present

## 2019-01-28 DIAGNOSIS — M4712 Other spondylosis with myelopathy, cervical region: Secondary | ICD-10-CM | POA: Diagnosis not present

## 2019-01-28 DIAGNOSIS — R4182 Altered mental status, unspecified: Secondary | ICD-10-CM | POA: Diagnosis not present

## 2019-01-28 DIAGNOSIS — J159 Unspecified bacterial pneumonia: Secondary | ICD-10-CM | POA: Diagnosis not present

## 2019-01-28 HISTORY — PX: IR PATIENT EVAL TECH 0-60 MINS: IMG5564

## 2019-01-28 LAB — RENAL FUNCTION PANEL
Albumin: 2.3 g/dL — ABNORMAL LOW (ref 3.5–5.0)
Anion gap: 18 — ABNORMAL HIGH (ref 5–15)
BUN: 148 mg/dL — ABNORMAL HIGH (ref 8–23)
CO2: 19 mmol/L — ABNORMAL LOW (ref 22–32)
Calcium: 7.8 mg/dL — ABNORMAL LOW (ref 8.9–10.3)
Chloride: 100 mmol/L (ref 98–111)
Creatinine, Ser: 2.81 mg/dL — ABNORMAL HIGH (ref 0.61–1.24)
GFR calc Af Amer: 24 mL/min — ABNORMAL LOW (ref >60)
GFR calc non Af Amer: 21 mL/min — ABNORMAL LOW (ref >60)
Glucose, Bld: 111 mg/dL — ABNORMAL HIGH (ref 70–99)
Phosphorus: 7.8 mg/dL — ABNORMAL HIGH (ref 2.5–4.6)
Potassium: 3.7 mmol/L (ref 3.5–5.1)
Sodium: 137 mmol/L (ref 135–145)

## 2019-01-28 LAB — C DIFFICILE QUICK SCREEN W PCR REFLEX
C Diff antigen: NEGATIVE
C Diff interpretation: NOT DETECTED
C Diff toxin: NEGATIVE

## 2019-01-28 LAB — BLOOD CULTURE ID PANEL (REFLEXED)
Acinetobacter baumannii: NOT DETECTED
Candida albicans: DETECTED — AB
Candida glabrata: NOT DETECTED
Candida krusei: NOT DETECTED
Candida parapsilosis: DETECTED — AB
Candida tropicalis: NOT DETECTED
Enterobacter cloacae complex: NOT DETECTED
Enterobacteriaceae species: NOT DETECTED
Enterococcus species: NOT DETECTED
Escherichia coli: NOT DETECTED
Haemophilus influenzae: NOT DETECTED
Klebsiella oxytoca: NOT DETECTED
Klebsiella pneumoniae: NOT DETECTED
Listeria monocytogenes: NOT DETECTED
Neisseria meningitidis: NOT DETECTED
Proteus species: NOT DETECTED
Pseudomonas aeruginosa: NOT DETECTED
Serratia marcescens: NOT DETECTED
Staphylococcus aureus (BCID): NOT DETECTED
Staphylococcus species: NOT DETECTED
Streptococcus agalactiae: NOT DETECTED
Streptococcus pneumoniae: NOT DETECTED
Streptococcus pyogenes: NOT DETECTED
Streptococcus species: NOT DETECTED

## 2019-01-28 LAB — CBC
HCT: 25.6 % — ABNORMAL LOW (ref 39.0–52.0)
Hemoglobin: 8 g/dL — ABNORMAL LOW (ref 13.0–17.0)
MCH: 29 pg (ref 26.0–34.0)
MCHC: 31.3 g/dL (ref 30.0–36.0)
MCV: 92.8 fL (ref 80.0–100.0)
Platelets: 123 10*3/uL — ABNORMAL LOW (ref 150–400)
RBC: 2.76 MIL/uL — ABNORMAL LOW (ref 4.22–5.81)
RDW: 18.9 % — ABNORMAL HIGH (ref 11.5–15.5)
WBC: 22.5 10*3/uL — ABNORMAL HIGH (ref 4.0–10.5)
nRBC: 1.2 % — ABNORMAL HIGH (ref 0.0–0.2)

## 2019-01-28 LAB — BLOOD GAS, ARTERIAL
Acid-base deficit: 8.7 mmol/L — ABNORMAL HIGH (ref 0.0–2.0)
Bicarbonate: 18.6 mmol/L — ABNORMAL LOW (ref 20.0–28.0)
FIO2: 75
O2 Saturation: 90.7 %
PEEP: 8 cmH2O
Patient temperature: 95.9
Pressure control: 16 cmH2O
RATE: 22 resp/min
pCO2 arterial: 50.5 mmHg — ABNORMAL HIGH (ref 32.0–48.0)
pH, Arterial: 7.18 — CL (ref 7.350–7.450)
pO2, Arterial: 63.2 mmHg — ABNORMAL LOW (ref 83.0–108.0)

## 2019-01-28 LAB — C-REACTIVE PROTEIN: CRP: 9.9 mg/dL — ABNORMAL HIGH (ref <1.0)

## 2019-01-28 LAB — LACTIC ACID, PLASMA: Lactic Acid, Venous: 3 mmol/L (ref 0.5–1.9)

## 2019-01-28 LAB — MAGNESIUM: Magnesium: 2.6 mg/dL — ABNORMAL HIGH (ref 1.7–2.4)

## 2019-01-28 MED ORDER — HEPARIN SODIUM (PORCINE) 1000 UNIT/ML IJ SOLN
INTRAMUSCULAR | Status: AC
  Filled 2019-01-28: qty 1

## 2019-01-28 MED ORDER — LIDOCAINE HCL 1 % IJ SOLN
INTRAMUSCULAR | Status: AC
  Filled 2019-01-28: qty 20

## 2019-01-28 NOTE — Progress Notes (Signed)
Pt too unstable to perform temp cath placement. Assisted RN and RT with transport back to pt room. Rad MD notified [facility] that we can perform procedure at bedside tomorrow if necessary.

## 2019-01-28 NOTE — Procedures (Signed)
Pt brought down for temporary hemodialysis catheter placement and O2 stats dropped after being placed on procedure table and pt became unstable.  Pt sent back upstairs without doing catheter placement.

## 2019-01-28 NOTE — Progress Notes (Addendum)
Pulmonary Critical Care Medicine [place]   PULMONARY CRITICAL CARE SERVICE  PROGRESS NOTE  Date of Service: 01/28/2019  Roger Nelson  CBJ:[id number]  DOB: [birth year]   DOA: 12/20/2018  Referring Physician: Merton Border, MD  HPI: Roger Nelson is a 78 y.o. male seen for follow up of Acute on Chronic Respiratory Failure.  Patient remains on full support on the ventilator at this time.  Requiring 75% FiO2.  Medications: Reviewed on Rounds  Physical Exam:  Vitals: Pulse 62 respirations 20 BP 105/50 O2 sat 94% 25.9  Ventilator Settings ventilator mode AC VC rate of 22 pressure 16 FiO2 35% PEEP of 8  . General: Comfortable at this time . Eyes: Grossly normal lids, irises & conjunctiva . ENT: grossly tongue is normal . Neck: no obvious mass . Cardiovascular: S1 S2 normal no gallop . Respiratory: Coarse breath sounds . Abdomen: soft . Skin: no rash seen on limited exam . Musculoskeletal: not rigid . Psychiatric:unable to assess . Neurologic: no seizure no involuntary movements         Lab Data:   Basic Metabolic Panel: Recent Labs  Lab 01/23/19 0543  01/25/19 0711  01/25/19 2323 01/26/19 1645 01/27/19 0654 01/27/19 1500 01/28/19 0812  NA 131*   < > 131*   < > 134* 135 137 136 137  K 4.9   < > 5.0   < > 4.2 4.2 4.4 4.1 3.7  CL 96*   < > 94*   < > 99 100 99 100 100  CO2 22   < > 20*   < > 21* 22 20* 19* 19*  GLUCOSE 168*   < > 146*   < > 97 129* 118* 127* 111*  BUN 88*   < > 113*   < > 118* 128* 136* 140* 148*  CREATININE 1.57*   < > 2.08*   < > 2.22* 2.31* 2.62* 2.63* 2.81*  CALCIUM 8.3*   < > 8.5*   < > 8.4* 8.6* 8.6* 8.2* 7.8*  MG 2.5*  --  2.7*  --   --   --  2.9*  --  2.6*  PHOS  --   --   --   --   --   --   --   --  7.8*   < > = values in this interval not displayed.    ABG: Recent Labs  Lab 01/27/19 0417 01/27/19 0650 01/28/19 0430  PHART 6.960* 7.049* 7.180*  PCO2ART 87.7* 70.0* 50.5*  PO2ART 95.6 78.6* 63.2*  HCO3 18.7* 18.8*  18.6*  O2SAT 95.2 93.3 90.7    Liver Function Tests: Recent Labs  Lab 01/23/19 0543 01/27/19 0654 01/28/19 0812  AST 63* 30  --   ALT 23 32  --   ALKPHOS 135* 148*  --   BILITOT 1.7* 0.7  --   PROT 4.7* 4.6*  --   ALBUMIN 1.6* 2.3* 2.3*   No results for input(s): LIPASE, AMYLASE in the last 168 hours. Recent Labs  Lab 01/25/19 1921  AMMONIA 39*    CBC: Recent Labs  Lab 01/25/19 0711 01/25/19 1820 01/26/19 0625 01/27/19 0654 01/28/19 0812  WBC 34.3* 29.0* 40.9* 28.2* 22.5*  HGB 6.8* 7.4* 7.0* 7.7* 8.0*  HCT 22.8* 24.1* 22.8* 24.8* 25.6*  MCV 100.0 95.6 98.7 96.5 92.8  PLT 230 177 204 137* 123*    Cardiac Enzymes: Recent Labs  Lab 01/27/19 0654  CKTOTAL 84    BNP (last 3 results)  Recent Labs    01/02/19 0613  BNP 604.8*    ProBNP (last 3 results) No results for input(s): PROBNP in the last 8760 hours.  Radiological Exams: Dg Abd Portable 1v  Result Date: 01/27/2019 CLINICAL DATA:  Sepsis EXAM: PORTABLE ABDOMEN - 1 VIEW COMPARISON:  01/09/2019 FINDINGS: Gastrostomy catheter is noted in the stomach. Postsurgical changes are lower lumbar spine bilateral hip joints. No obstructive pattern is seen. Degenerative changes of the lumbar spine noted. IMPRESSION: Gastrostomy catheter in place.  No acute abnormality is noted. Electronically Signed   By: Inez Catalina M.D.   On: 01/27/2019 22:45   Ir Patient Eval Tech 0-60 Mins  Result Date: 01/28/2019 Darnelle Spangle     01/28/2019  3:48 PM Pt brought down for temporary hemodialysis catheter placement and O2 stats dropped after being placed on procedure table and pt became unstable.  Pt sent back upstairs without doing catheter placement.   Assessment/Plan Active Problems:   Acute on chronic respiratory failure with hypoxia (HCC)   Bacterial lobar pneumonia   Cervical myelopathy with cervical radiculopathy   Altered mental status   Chronic atrial fibrillation   Respiratory failure ([place])   1. Acute on  chronic respiratory failure with hypoxia continue full support at this time.  Continue secretion management and pulmonary toilet.  Patient's prognosis remains guarded 2. Bacterial lobar pneumonia treated continue to monitor next cervical myelopathy unchanged continue support care 3. Altered mental status unchanged chronic atrial fibrillation rate controlled   I have personally seen and evaluated the patient, evaluated laboratory and imaging results, formulated the assessment and plan and placed orders. The Patient requires high complexity decision making for assessment and support.  Case was discussed on Rounds with the Respiratory Therapy Staff  Allyne Gee, MD [facility] Pulmonary Critical Care Medicine Sleep Medicine

## 2019-01-29 ENCOUNTER — Other Ambulatory Visit (HOSPITAL_COMMUNITY): Payer: Self-pay

## 2019-01-29 ENCOUNTER — Encounter (HOSPITAL_COMMUNITY): Payer: Self-pay | Admitting: Diagnostic Radiology

## 2019-01-29 DIAGNOSIS — J159 Unspecified bacterial pneumonia: Secondary | ICD-10-CM | POA: Diagnosis not present

## 2019-01-29 DIAGNOSIS — J9621 Acute and chronic respiratory failure with hypoxia: Secondary | ICD-10-CM | POA: Diagnosis not present

## 2019-01-29 DIAGNOSIS — M4712 Other spondylosis with myelopathy, cervical region: Secondary | ICD-10-CM | POA: Diagnosis not present

## 2019-01-29 DIAGNOSIS — R4182 Altered mental status, unspecified: Secondary | ICD-10-CM | POA: Diagnosis not present

## 2019-01-29 HISTORY — PX: IR US GUIDE VASC ACCESS RIGHT: IMG2390

## 2019-01-29 LAB — CBC
HCT: 23.4 % — ABNORMAL LOW (ref 39.0–52.0)
HCT: 25 % — ABNORMAL LOW (ref 39.0–52.0)
Hemoglobin: 7.5 g/dL — ABNORMAL LOW (ref 13.0–17.0)
Hemoglobin: 7.9 g/dL — ABNORMAL LOW (ref 13.0–17.0)
MCH: 30.4 pg (ref 26.0–34.0)
MCH: 29.8 pg (ref 26.0–34.0)
MCHC: 32.1 g/dL (ref 30.0–36.0)
MCHC: 31.6 g/dL (ref 30.0–36.0)
MCV: 94.7 fL (ref 80.0–100.0)
MCV: 94.3 fL (ref 80.0–100.0)
Platelets: 95 10*3/uL — ABNORMAL LOW (ref 150–400)
Platelets: 99 10*3/uL — ABNORMAL LOW (ref 150–400)
RBC: 2.47 MIL/uL — ABNORMAL LOW (ref 4.22–5.81)
RBC: 2.65 MIL/uL — ABNORMAL LOW (ref 4.22–5.81)
RDW: 18.8 % — ABNORMAL HIGH (ref 11.5–15.5)
RDW: 19 % — ABNORMAL HIGH (ref 11.5–15.5)
WBC: 18 10*3/uL — ABNORMAL HIGH (ref 4.0–10.5)
WBC: 21.9 10*3/uL — ABNORMAL HIGH (ref 4.0–10.5)
nRBC: 1.1 % — ABNORMAL HIGH (ref 0.0–0.2)
nRBC: 1.9 % — ABNORMAL HIGH (ref 0.0–0.2)

## 2019-01-29 LAB — RENAL FUNCTION PANEL
Albumin: 2 g/dL — ABNORMAL LOW (ref 3.5–5.0)
Albumin: 2 g/dL — ABNORMAL LOW (ref 3.5–5.0)
Anion gap: 17 — ABNORMAL HIGH (ref 5–15)
Anion gap: 17 — ABNORMAL HIGH (ref 5–15)
BUN: 153 mg/dL — ABNORMAL HIGH (ref 8–23)
BUN: 154 mg/dL — ABNORMAL HIGH (ref 8–23)
CO2: 19 mmol/L — ABNORMAL LOW (ref 22–32)
CO2: 18 mmol/L — ABNORMAL LOW (ref 22–32)
Calcium: 7.2 mg/dL — ABNORMAL LOW (ref 8.9–10.3)
Calcium: 7.1 mg/dL — ABNORMAL LOW (ref 8.9–10.3)
Chloride: 102 mmol/L (ref 98–111)
Chloride: 102 mmol/L (ref 98–111)
Creatinine, Ser: 3.04 mg/dL — ABNORMAL HIGH (ref 0.61–1.24)
Creatinine, Ser: 2.98 mg/dL — ABNORMAL HIGH (ref 0.61–1.24)
GFR calc Af Amer: 22 mL/min — ABNORMAL LOW (ref >60)
GFR calc Af Amer: 23 mL/min — ABNORMAL LOW (ref >60)
GFR calc non Af Amer: 19 mL/min — ABNORMAL LOW (ref >60)
GFR calc non Af Amer: 20 mL/min — ABNORMAL LOW (ref >60)
Glucose, Bld: 150 mg/dL — ABNORMAL HIGH (ref 70–99)
Glucose, Bld: 175 mg/dL — ABNORMAL HIGH (ref 70–99)
Phosphorus: 8.5 mg/dL — ABNORMAL HIGH (ref 2.5–4.6)
Phosphorus: 8.6 mg/dL — ABNORMAL HIGH (ref 2.5–4.6)
Potassium: 3.9 mmol/L (ref 3.5–5.1)
Potassium: 4 mmol/L (ref 3.5–5.1)
Sodium: 138 mmol/L (ref 135–145)
Sodium: 137 mmol/L (ref 135–145)

## 2019-01-29 LAB — HEPATITIS B SURFACE ANTIGEN: Hepatitis B Surface Ag: NEGATIVE

## 2019-01-29 LAB — MAGNESIUM: Magnesium: 2.7 mg/dL — ABNORMAL HIGH (ref 1.7–2.4)

## 2019-01-29 LAB — LACTIC ACID, PLASMA: Lactic Acid, Venous: 3 mmol/L (ref 0.5–1.9)

## 2019-01-29 LAB — HEPATITIS B SURFACE ANTIBODY, QUANTITATIVE: Hep B S AB Quant (Post): <3.1 m[IU]/mL — ABNORMAL LOW (ref >9.9)

## 2019-01-29 LAB — HEPATITIS B CORE ANTIBODY, IGM: Hep B C IgM: NEGATIVE

## 2019-01-29 MED ORDER — LIDOCAINE HCL 1 % IJ SOLN
INTRAMUSCULAR | Status: AC
  Filled 2019-01-29: qty 20

## 2019-01-29 MED ORDER — HEPARIN SODIUM (PORCINE) 1000 UNIT/ML IJ SOLN
INTRAMUSCULAR | Status: AC
  Administered 2019-01-29: 2.6 mL
  Filled 2019-01-29: qty 1

## 2019-01-29 MED ORDER — LIDOCAINE HCL 1 % IJ SOLN
INTRAMUSCULAR | Status: AC | PRN
  Administered 2019-01-29: 5 mL

## 2019-01-29 NOTE — Progress Notes (Signed)
[facility]  ROUNDING NOTE   Subjective:  Patient went for temporary dialysis catheter yesterday however he had a respiratory event and became unstable. Therefore he was sent back upstairs. Patient did have temporary dialysis catheter placed today. Right internal jugular non-tunneled dialysis catheter was placed.  Objective:  Vital signs in last 24 hours:  Temperature 96 pulse 68 respirations 24 blood pressure 108/55  Physical Exam: General:  Critically ill-appearing  Head: Normocephalic, atraumatic. Dry oral mucosal membranes  Eyes: Anicteric  Neck: Supple, trachea midline  Lungs:  Scattered rhonchi, increased work of breathing   Heart: S1S2 no rubs  Abdomen:  Soft, nontender, bowel sounds present  Extremities: 3+ peripheral edema.  Neurologic: Not responding to commands  Skin: Weeping skin  Access: Right internal jugular temporary dialysis catheter    Basic Metabolic Panel: Recent Labs  Lab 01/23/19 0543  01/25/19 0711  01/26/19 1645 01/27/19 0654 01/27/19 1500 01/28/19 0812 01/29/19 0642  NA 131*   < > 131*   < > 135 137 136 137 138  K 4.9   < > 5.0   < > 4.2 4.4 4.1 3.7 3.9  CL 96*   < > 94*   < > 100 99 100 100 102  CO2 22   < > 20*   < > 22 20* 19* 19* 19*  GLUCOSE 168*   < > 146*   < > 129* 118* 127* 111* 150*  BUN 88*   < > 113*   < > 128* 136* 140* 148* 153*  CREATININE 1.57*   < > 2.08*   < > 2.31* 2.62* 2.63* 2.81* 3.04*  CALCIUM 8.3*   < > 8.5*   < > 8.6* 8.6* 8.2* 7.8* 7.2*  MG 2.5*  --  2.7*  --   --  2.9*  --  2.6* 2.7*  PHOS  --   --   --   --   --   --   --  7.8* 8.5*   < > = values in this interval not displayed.    Liver Function Tests: Recent Labs  Lab 01/23/19 0543 01/27/19 0654 01/28/19 0812 01/29/19 0642  AST 63* 30  --   --   ALT 23 32  --   --   ALKPHOS 135* 148*  --   --   BILITOT 1.7* 0.7  --   --   PROT 4.7* 4.6*  --   --   ALBUMIN 1.6* 2.3* 2.3* 2.0*   No results for input(s): LIPASE, AMYLASE in the last 168  hours. Recent Labs  Lab 01/25/19 1921  AMMONIA 39*    CBC: Recent Labs  Lab 01/25/19 1820 01/26/19 0625 01/27/19 0654 01/28/19 0812 01/29/19 0642  WBC 29.0* 40.9* 28.2* 22.5* 18.0*  HGB 7.4* 7.0* 7.7* 8.0* 7.5*  HCT 24.1* 22.8* 24.8* 25.6* 23.4*  MCV 95.6 98.7 96.5 92.8 94.7  PLT 177 204 137* 123* 95*    Cardiac Enzymes: Recent Labs  Lab 01/27/19 0654  CKTOTAL 84    BNP: Invalid input(s): POCBNP  CBG: No results for input(s): GLUCAP in the last 168 hours.  Microbiology: Results for orders placed or performed during the hospital encounter of 12/23/2018  Culture, respiratory (non-expectorated)     Status: None   Collection Time: 12/20/2018  6:42 PM  Result Value Ref Range Status   Specimen Description TRACHEAL ASPIRATE  Final   Special Requests NONE  Final   Gram Stain   Final    MODERATE  WBC PRESENT, PREDOMINANTLY PMN FEW GRAM NEGATIVE RODS Performed at [facility], [place] [address], [place], [place] [address]    Culture   Final    FEW KLEBSIELLA PNEUMONIAE FEW ENTEROBACTER CLOACAE    Report Status 01/06/2019 FINAL  Final   Organism ID, Bacteria KLEBSIELLA PNEUMONIAE  Final   Organism ID, Bacteria ENTEROBACTER CLOACAE  Final      Susceptibility   Enterobacter cloacae - MIC*    CEFAZOLIN >=64 RESISTANT Resistant     CEFEPIME 4 SENSITIVE Sensitive     CEFTAZIDIME >=64 RESISTANT Resistant     CEFTRIAXONE >=64 RESISTANT Resistant     CIPROFLOXACIN <=0.25 SENSITIVE Sensitive     GENTAMICIN <=1 SENSITIVE Sensitive     IMIPENEM 0.5 SENSITIVE Sensitive     TRIMETH/SULFA <=20 SENSITIVE Sensitive     PIP/TAZO >=128 RESISTANT Resistant     * FEW ENTEROBACTER CLOACAE   Klebsiella pneumoniae - MIC*    AMPICILLIN >=32 RESISTANT Resistant     CEFAZOLIN >=64 RESISTANT Resistant     CEFEPIME <=1 SENSITIVE Sensitive     CEFTAZIDIME 32 RESISTANT Resistant     CEFTRIAXONE <=1 SENSITIVE Sensitive     CIPROFLOXACIN <=0.25 SENSITIVE Sensitive     GENTAMICIN <=1  SENSITIVE Sensitive     IMIPENEM <=0.25 SENSITIVE Sensitive     TRIMETH/SULFA <=20 SENSITIVE Sensitive     AMPICILLIN/SULBACTAM RESISTANT Resistant     PIP/TAZO >=128 RESISTANT Resistant     Extended ESBL NEGATIVE Sensitive     * FEW KLEBSIELLA PNEUMONIAE  Culture, respiratory (non-expectorated)     Status: None   Collection Time: 01/12/19 11:03 AM  Result Value Ref Range Status   Specimen Description TRACHEAL ASPIRATE  Final   Special Requests NONE  Final   Gram Stain   Final    ABUNDANT WBC PRESENT,BOTH PMN AND MONONUCLEAR RARE SQUAMOUS EPITHELIAL CELLS PRESENT MODERATE GRAM NEGATIVE RODS FEW GRAM POSITIVE COCCI IN PAIRS FEW GRAM POSITIVE RODS Performed at [facility], [place] [address] [place], [place] [address]    Culture   Final    MODERATE ENTEROBACTER CLOACAE MODERATE KLEBSIELLA PNEUMONIAE MODERATE METHICILLIN RESISTANT STAPHYLOCOCCUS AUREUS    Report Status 01/15/2019 FINAL  Final   Organism ID, Bacteria ENTEROBACTER CLOACAE  Final   Organism ID, Bacteria KLEBSIELLA PNEUMONIAE  Final   Organism ID, Bacteria METHICILLIN RESISTANT STAPHYLOCOCCUS AUREUS  Final      Susceptibility   Enterobacter cloacae - MIC*    CEFAZOLIN >=64 RESISTANT Resistant     CEFEPIME 2 SENSITIVE Sensitive     CEFTAZIDIME >=64 RESISTANT Resistant     CEFTRIAXONE >=64 RESISTANT Resistant     CIPROFLOXACIN <=0.25 SENSITIVE Sensitive     GENTAMICIN <=1 SENSITIVE Sensitive     IMIPENEM <=0.25 SENSITIVE Sensitive     TRIMETH/SULFA <=20 SENSITIVE Sensitive     PIP/TAZO >=128 RESISTANT Resistant     * MODERATE ENTEROBACTER CLOACAE   Klebsiella pneumoniae - MIC*    AMPICILLIN >=32 RESISTANT Resistant     CEFAZOLIN <=4 SENSITIVE Sensitive     CEFEPIME <=1 SENSITIVE Sensitive     CEFTAZIDIME <=1 SENSITIVE Sensitive     CEFTRIAXONE <=1 SENSITIVE Sensitive     CIPROFLOXACIN <=0.25 SENSITIVE Sensitive     GENTAMICIN <=1 SENSITIVE Sensitive     IMIPENEM <=0.25 SENSITIVE Sensitive      TRIMETH/SULFA <=20 SENSITIVE Sensitive     AMPICILLIN/SULBACTAM 8 SENSITIVE Sensitive     PIP/TAZO <=4 SENSITIVE Sensitive     Extended ESBL  NEGATIVE Sensitive     * MODERATE KLEBSIELLA PNEUMONIAE   Methicillin resistant staphylococcus aureus - MIC*    CIPROFLOXACIN <=0.5 SENSITIVE Sensitive     ERYTHROMYCIN 0.5 SENSITIVE Sensitive     GENTAMICIN <=0.5 SENSITIVE Sensitive     OXACILLIN >=4 RESISTANT Resistant     TETRACYCLINE <=1 SENSITIVE Sensitive     VANCOMYCIN <=0.5 SENSITIVE Sensitive     TRIMETH/SULFA <=10 SENSITIVE Sensitive     CLINDAMYCIN <=0.25 SENSITIVE Sensitive     RIFAMPIN <=0.5 SENSITIVE Sensitive     Inducible Clindamycin NEGATIVE Sensitive     * MODERATE METHICILLIN RESISTANT STAPHYLOCOCCUS AUREUS  Culture, Urine     Status: None   Collection Time: 01/18/19  7:55 AM  Result Value Ref Range Status   Specimen Description URINE, RANDOM  Final   Special Requests NONE  Final   Culture   Final    NO GROWTH Performed at [facility], [place] [address] [place], [place] [address]    Report Status 01/19/2019 FINAL  Final  Culture, respiratory     Status: None   Collection Time: 01/18/19  7:57 AM  Result Value Ref Range Status   Specimen Description TRACHEAL ASPIRATE  Final   Special Requests NONE  Final   Gram Stain   Final    FEW WBC PRESENT, PREDOMINANTLY PMN MODERATE BUDDING YEAST SEEN Performed at [facility], [place] [address] [place], [place] [address]    Culture FEW CANDIDA LUSITANIAE  Final   Report Status 01/20/2019 FINAL  Final  Culture, blood (routine x 2)     Status: None   Collection Time: 01/18/19  8:43 AM  Result Value Ref Range Status   Specimen Description BLOOD LEFT HAND  Final   Special Requests   Final    BOTTLES DRAWN AEROBIC ONLY Blood Culture results may not be optimal due to an inadequate volume of blood received in culture bottles   Culture   Final    NO GROWTH 5 DAYS Performed at [facility], [place] [address]  [place], [place] [address]    Report Status 01/23/2019 FINAL  Final  Culture, blood (routine x 2)     Status: Abnormal   Collection Time: 01/18/19  8:52 AM  Result Value Ref Range Status   Specimen Description BLOOD LEFT HAND  Final   Special Requests   Final    BOTTLES DRAWN AEROBIC ONLY Blood Culture results may not be optimal due to an inadequate volume of blood received in culture bottles   Culture  Setup Time   Final    GRAM POSITIVE COCCI AEROBIC BOTTLE ONLY CRITICAL RESULT CALLED TO, READ BACK BY AND VERIFIED WITH: Furigay RN 11:20 01/20/19 (wilsonm)    Culture (A)  Final    STAPHYLOCOCCUS SPECIES (COAGULASE NEGATIVE) THE SIGNIFICANCE OF ISOLATING THIS ORGANISM FROM A SINGLE SET OF BLOOD CULTURES WHEN MULTIPLE SETS ARE DRAWN IS UNCERTAIN. PLEASE NOTIFY THE MICROBIOLOGY DEPARTMENT WITHIN ONE WEEK IF SPECIATION AND SENSITIVITIES ARE REQUIRED. Performed at [facility], [place] [address] [place], [place] [address]    Report Status 01/22/2019 FINAL  Final  Blood Culture ID Panel (Reflexed)     Status: Abnormal   Collection Time: 01/18/19  8:52 AM  Result Value Ref Range Status   Enterococcus species NOT DETECTED NOT DETECTED Final   Listeria monocytogenes NOT DETECTED NOT DETECTED Final   Staphylococcus species DETECTED (A) NOT DETECTED Final    Comment: Methicillin (oxacillin) resistant coagulase negative  staphylococcus. Possible blood culture contaminant (unless isolated from more than one blood culture draw or clinical case suggests pathogenicity). No antibiotic treatment is indicated for blood  culture contaminants. CRITICAL RESULT CALLED TO, READ BACK BY AND VERIFIED WITH: Furigay RN 11:20 01/20/19 (wilsonm)    Staphylococcus aureus (BCID) NOT DETECTED NOT DETECTED Final   Methicillin resistance DETECTED (A) NOT DETECTED Final    Comment: CRITICAL RESULT CALLED TO, READ BACK BY AND VERIFIED WITH: Furigay RN 11:20 01/20/19 (wilsonm)    Streptococcus species NOT DETECTED NOT  DETECTED Final   Streptococcus agalactiae NOT DETECTED NOT DETECTED Final   Streptococcus pneumoniae NOT DETECTED NOT DETECTED Final   Streptococcus pyogenes NOT DETECTED NOT DETECTED Final   Acinetobacter baumannii NOT DETECTED NOT DETECTED Final   Enterobacteriaceae species NOT DETECTED NOT DETECTED Final   Enterobacter cloacae complex NOT DETECTED NOT DETECTED Final   Escherichia coli NOT DETECTED NOT DETECTED Final   Klebsiella oxytoca NOT DETECTED NOT DETECTED Final   Klebsiella pneumoniae NOT DETECTED NOT DETECTED Final   Proteus species NOT DETECTED NOT DETECTED Final   Serratia marcescens NOT DETECTED NOT DETECTED Final   Haemophilus influenzae NOT DETECTED NOT DETECTED Final   Neisseria meningitidis NOT DETECTED NOT DETECTED Final   Pseudomonas aeruginosa NOT DETECTED NOT DETECTED Final   Candida albicans NOT DETECTED NOT DETECTED Final   Candida glabrata NOT DETECTED NOT DETECTED Final   Candida krusei NOT DETECTED NOT DETECTED Final   Candida parapsilosis NOT DETECTED NOT DETECTED Final   Candida tropicalis NOT DETECTED NOT DETECTED Final    Comment: Performed at [facility], [address], [place], [place] [address]  Culture, blood (routine x 2)     Status: Abnormal (Preliminary result)   Collection Time: 01/25/19  7:15 PM  Result Value Ref Range Status   Specimen Description BLOOD RIGHT ARM  Final   Special Requests   Final    BOTTLES DRAWN AEROBIC ONLY Blood Culture adequate volume   Culture  Setup Time   Final    AEROBIC BOTTLE ONLY YEAST IDENTIFICATION TO FOLLOW CRITICAL RESULT CALLED TO, READ BACK BY AND VERIFIED WITH: C MASEKO RN 01/28/19 0005 JDW    Culture (A)  Final    CANDIDA ALBICANS CULTURE REINCUBATED FOR BETTER GROWTH Performed at [facility], [place] [address] [place], [place] [address]    Report Status PENDING  Incomplete  Culture, blood (routine x 2)     Status: None (Preliminary result)   Collection Time: 01/25/19  7:20 PM  Result  Value Ref Range Status   Specimen Description BLOOD RIGHT HAND  Final   Special Requests   Final    BOTTLES DRAWN AEROBIC AND ANAEROBIC Blood Culture adequate volume   Culture   Final    NO GROWTH 4 DAYS Performed at [facility], [place] [address] [place], [place] [address]    Report Status PENDING  Incomplete  Blood Culture ID Panel (Reflexed)     Status: Abnormal   Collection Time: 01/25/19  7:21 PM  Result Value Ref Range Status   Enterococcus species NOT DETECTED NOT DETECTED Final   Listeria monocytogenes NOT DETECTED NOT DETECTED Final   Staphylococcus species NOT DETECTED NOT DETECTED Final   Staphylococcus aureus (BCID) NOT DETECTED NOT DETECTED Final   Streptococcus species NOT DETECTED NOT DETECTED Final   Streptococcus agalactiae NOT DETECTED NOT DETECTED Final   Streptococcus pneumoniae NOT DETECTED NOT DETECTED Final   Streptococcus pyogenes NOT DETECTED NOT DETECTED  Final   Acinetobacter baumannii NOT DETECTED NOT DETECTED Final   Enterobacteriaceae species NOT DETECTED NOT DETECTED Final   Enterobacter cloacae complex NOT DETECTED NOT DETECTED Final   Escherichia coli NOT DETECTED NOT DETECTED Final   Klebsiella oxytoca NOT DETECTED NOT DETECTED Final   Klebsiella pneumoniae NOT DETECTED NOT DETECTED Final   Proteus species NOT DETECTED NOT DETECTED Final   Serratia marcescens NOT DETECTED NOT DETECTED Final   Haemophilus influenzae NOT DETECTED NOT DETECTED Final   Neisseria meningitidis NOT DETECTED NOT DETECTED Final   Pseudomonas aeruginosa NOT DETECTED NOT DETECTED Final   Candida albicans DETECTED (A) NOT DETECTED Final    Comment: CRITICAL RESULT CALLED TO, READ BACK BY AND VERIFIED WITH: C MASEKO RN 01/28/19 0005 JDW    Candida glabrata NOT DETECTED NOT DETECTED Final   Candida krusei NOT DETECTED NOT DETECTED Final   Candida parapsilosis DETECTED (A) NOT DETECTED Final    Comment: CRITICAL RESULT CALLED TO, READ BACK BY AND VERIFIED WITH: C MASEKO RN  01/28/19 0005 JDW    Candida tropicalis NOT DETECTED NOT DETECTED Final    Comment: Performed at [facility], [place] [address], [place], [place] [address]  Respiratory Panel by PCR     Status: None   Collection Time: 01/26/19 12:28 PM  Result Value Ref Range Status   Adenovirus NOT DETECTED NOT DETECTED Final   Coronavirus 229E NOT DETECTED NOT DETECTED Final    Comment: (NOTE) The Coronavirus on the Respiratory Panel, DOES NOT test for the novel  Coronavirus (2019 nCoV)    Coronavirus HKU1 NOT DETECTED NOT DETECTED Final   Coronavirus NL63 NOT DETECTED NOT DETECTED Final   Coronavirus OC43 NOT DETECTED NOT DETECTED Final   Metapneumovirus NOT DETECTED NOT DETECTED Final   Rhinovirus / Enterovirus NOT DETECTED NOT DETECTED Final   Influenza A NOT DETECTED NOT DETECTED Final   Influenza B NOT DETECTED NOT DETECTED Final   Parainfluenza Virus 1 NOT DETECTED NOT DETECTED Final   Parainfluenza Virus 2 NOT DETECTED NOT DETECTED Final   Parainfluenza Virus 3 NOT DETECTED NOT DETECTED Final   Parainfluenza Virus 4 NOT DETECTED NOT DETECTED Final   Respiratory Syncytial Virus NOT DETECTED NOT DETECTED Final   Bordetella pertussis NOT DETECTED NOT DETECTED Final   Chlamydophila pneumoniae NOT DETECTED NOT DETECTED Final   Mycoplasma pneumoniae NOT DETECTED NOT DETECTED Final    Comment: Performed at [facility], [place] [address], [place], [place] [address]  SARS Coronavirus 2 [facility] order, Performed in [facility] hospital lab)     Status: None   Collection Time: 01/27/19  9:25 PM  Result Value Ref Range Status   SARS Coronavirus 2 NEGATIVE NEGATIVE Final    Comment: (NOTE) If result is NEGATIVE SARS-CoV-2 target nucleic acids are NOT DETECTED. The SARS-CoV-2 RNA is generally detectable in upper and lower  respiratory specimens during the acute phase of infection. The lowest  concentration of SARS-CoV-2 viral copies this assay can detect is 250  copies / mL. A negative  result does not preclude SARS-CoV-2 infection  and should not be used as the sole basis for treatment or other  patient management decisions.  A negative result may occur with  improper specimen collection / handling, submission of specimen other  than nasopharyngeal swab, presence of viral mutation(s) within the  areas targeted by this assay, and inadequate number of viral copies  (<250 copies / mL). A negative result must be combined with clinical  observations,  patient history, and epidemiological information. If result is POSITIVE SARS-CoV-2 target nucleic acids are DETECTED. The SARS-CoV-2 RNA is generally detectable in upper and lower  respiratory specimens dur ing the acute phase of infection.  Positive  results are indicative of active infection with SARS-CoV-2.  Clinical  correlation with patient history and other diagnostic information is  necessary to determine patient infection status.  Positive results do  not rule out bacterial infection or co-infection with other viruses. If result is PRESUMPTIVE POSTIVE SARS-CoV-2 nucleic acids MAY BE PRESENT.   A presumptive positive result was obtained on the submitted specimen  and confirmed on repeat testing.  While 2019 novel coronavirus  (SARS-CoV-2) nucleic acids may be present in the submitted sample  additional confirmatory testing may be necessary for epidemiological  and / or clinical management purposes  to differentiate between  SARS-CoV-2 and other Sarbecovirus currently known to infect humans.  If clinically indicated additional testing with an alternate test  methodology [phone]) is advised. The SARS-CoV-2 RNA is generally  detectable in upper and lower respiratory sp ecimens during the acute  phase of infection. The expected result is Negative. Fact Sheet for Patients:  StrictlyIdeas.no Fact Sheet for Healthcare Providers: BankingDealers.co.za This test is not yet  approved or cleared by the [place] FDA and has been authorized for detection and/or diagnosis of SARS-CoV-2 by FDA under an Emergency Use Authorization (EUA).  This EUA will remain in effect (meaning this test can be used) for the duration of the COVID-19 declaration under Section 564(b)(1) of the Act, 21 U.S.C. section 360bbb-3(b)(1), unless the authorization is terminated or revoked sooner. Performed at [facility], [place] [address] [place], [place] [address]   C difficile quick scan w PCR reflex     Status: None   Collection Time: 01/28/19  1:29 PM  Result Value Ref Range Status   C Diff antigen NEGATIVE NEGATIVE Final   C Diff toxin NEGATIVE NEGATIVE Final   C Diff interpretation No C. difficile detected.  Final    Comment: Performed at [facility], [place] [address] [place], [place] [address]    Coagulation Studies: No results for input(s): LABPROT, INR in the last 72 hours.  Urinalysis: No results for input(s): COLORURINE, LABSPEC, PHURINE, GLUCOSEU, HGBUR, BILIRUBINUR, KETONESUR, PROTEINUR, UROBILINOGEN, NITRITE, LEUKOCYTESUR in the last 72 hours.  Invalid input(s): APPERANCEUR    Imaging: Ir US Guide Vasc Access Right  Result Date: 01/29/2019 INDICATION: 78 year old with respiratory distress and tracheostomy tube. Patient also has acute renal failure and needs a temporary dialysis catheter. Patient cannot be safely move onto the interventional table due to respiratory distress. Plan for placement right jugular central line with ultrasound guidance and will check catheter positioning with chest radiography. EXAM: ULTRASOUND GUIDED PLACEMENT OF A NON-TUNNELED DIALYSIS CATHETER Physician: Stephan Minister. Anselm Pancoast, MD MEDICATIONS: None ANESTHESIA/SEDATION: None FLUOROSCOPY TIME:  None COMPLICATIONS: None immediate. PROCEDURE: Informed consent was obtained for catheter placement. The patient was placed supine on the interventional table. Ultrasound confirmed a patent right  internal jugular vein. Ultrasound images were obtained for documentation. The right side of the neck was prepped and draped in a sterile fashion. The right neck was anesthetized with 1% lidocaine. Maximal barrier sterile technique was utilized including caps, mask, sterile gowns, sterile gloves, sterile drape, hand hygiene and skin antiseptic. A small incision was made with #11 blade scalpel. Needle was directed into the right internal jugular vein with ultrasound guidance. J wire was easily advanced through the needle and  into the central venous system. Tract was dilated to accommodate a 16 [place] Mahurkar catheter. Portable chest x-ray demonstrated that the catheter was too short and near the upper SVC. Therefore, the catheter was exchanged over the J-wire for a 20 cm Mahurkar catheter. Lumens aspirated and flushed well. Appropriate amount of heparin was placed in the dialysis lumens. Catheter was sutured to skin. Follow-up chest radiograph was obtained. FINDINGS: Patent right internal jugular vein. Post placement chest radiograph demonstrates the catheter tip in the lower SVC region. No evidence for a large pneumothorax. IMPRESSION: Successful placement of a right jugular non-tunneled dialysis catheter using ultrasound guidance. Electronically Signed   By: Markus Daft M.D.   On: 01/29/2019 14:02   Dg Chest Port 1 View  Result Date: 01/29/2019 CLINICAL DATA:  Placement of right jugular non tunneled dialysis catheter with ultrasound. Evaluate catheter tip placement. EXAM: PORTABLE CHEST 1 VIEW COMPARISON:  01/26/2019 FINDINGS: Right jugular central line has been placed. Catheter tip appears to be in the lower SVC. Slightly increased bilateral airspace disease particularly in the left lung. Again noted is a tracheostomy tube. Surgical plate in the lower cervical spine. Heart size remains enlarged. IMPRESSION: Dialysis catheter tip in the lower SVC region. Negative for a large pneumothorax. Bilateral airspace  disease, slight progression since 01/26/2019. Electronically Signed   By: Markus Daft M.D.   On: 01/29/2019 14:04   Dg Abd Portable 1v  Result Date: 01/27/2019 CLINICAL DATA:  Sepsis EXAM: PORTABLE ABDOMEN - 1 VIEW COMPARISON:  12/17/2018 FINDINGS: Gastrostomy catheter is noted in the stomach. Postsurgical changes are lower lumbar spine bilateral hip joints. No obstructive pattern is seen. Degenerative changes of the lumbar spine noted. IMPRESSION: Gastrostomy catheter in place.  No acute abnormality is noted. Electronically Signed   By: Inez Catalina M.D.   On: 01/27/2019 22:45   Ir Patient Eval Tech 0-60 Mins  Result Date: 01/28/2019 Darnelle Spangle     01/28/2019  3:48 PM Pt brought down for temporary hemodialysis catheter placement and O2 stats dropped after being placed on procedure table and pt became unstable.  Pt sent back upstairs without doing catheter placement.    Medications:    . lidocaine         Assessment/ Plan:  78 y.o. male with a PMHx of atrial fibrillation, right eye melanoma, chronic back pain, depression, diabetes mellitus type 2, GERD, hypertension, obstructive sleep apnea, who was admitted to [facility] on 12/30/2018 for on going treatment of acute respiratory failure.  1.  Acute renal failure, suspect multifactorial with contributions from diuretic usage. 2.  Acute respiratory failure. 3.  Metabolic acidosis. 4.  Anemia unspecified.  Plan: Temporary dialysis catheter placement was attempted yesterday however patient became too unstable and was sent back upstairs.  He will be due for dialysis today and we will prepare orders for this.  We will plan for dialysis again tomorrow as well.  Patient has severe acute respiratory failure and currently requiring 100% FiO2.  Attempt ultrafiltration with dialysis tomorrow.  Overall prognosis quite poor.   LOS: 0 Cher Egnor 4/15/20203:41 PM

## 2019-01-29 NOTE — Procedures (Signed)
Interventional Radiology Procedure:   Indications: Acute renal failure  Procedure: Placement of right jugular non-tunneled dialysis catheter.   Findings: Patent right IJ.   20 cm catheter placed.  F/u CXR demonstrates tip near lower SVC.  Complications: None     EBL: Less than 10 ml  Plan: Catheter is ready to use.    Roger Nelson R. Anselm Pancoast, MD  Pager: [phone]

## 2019-01-29 NOTE — Progress Notes (Addendum)
Pulmonary Critical Care Medicine [place]   PULMONARY CRITICAL CARE SERVICE  PROGRESS NOTE  Date of Service: 01/29/2019  Roger Nelson  OEV:[id number]  DOB: 02-10-1941   DOA: 01/12/2019  Referring Physician: Merton Border, MD  HPI: Roger Nelson is a 78 y.o. male seen for follow up of Acute on Chronic Respiratory Failure.  Patient is currently on Levophed drip and is having a dialysis catheter placed.  Apparently patient's daughter was not comfortable with comfort care at this time and would like to escalate the patient's care further.  At this time dialysis again discussed.  Medications: Reviewed on Rounds  Physical Exam:  Vitals: Pulse 68 respirations 24 BP 108/55 O2 sat 96% temp 96.0  Ventilator Settings ventilator mode AC VC rate of 20 pressure 16 FiO2 35% PEEP of 8  . General: Comfortable at this time . Eyes: Grossly normal lids, irises & conjunctiva . ENT: grossly tongue is normal . Neck: no obvious mass . Cardiovascular: S1 S2 normal no gallop . Respiratory: Coarse breath sounds . Abdomen: soft . Skin: no rash seen on limited exam . Musculoskeletal: not rigid . Psychiatric:unable to assess . Neurologic: no seizure no involuntary movements         Lab Data:   Basic Metabolic Panel: Recent Labs  Lab 01/23/19 0543  01/25/19 0711  01/26/19 1645 01/27/19 0654 01/27/19 1500 01/28/19 0812 01/29/19 0642  NA 131*   < > 131*   < > 135 137 136 137 138  K 4.9   < > 5.0   < > 4.2 4.4 4.1 3.7 3.9  CL 96*   < > 94*   < > 100 99 100 100 102  CO2 22   < > 20*   < > 22 20* 19* 19* 19*  GLUCOSE 168*   < > 146*   < > 129* 118* 127* 111* 150*  BUN 88*   < > 113*   < > 128* 136* 140* 148* 153*  CREATININE 1.57*   < > 2.08*   < > 2.31* 2.62* 2.63* 2.81* 3.04*  CALCIUM 8.3*   < > 8.5*   < > 8.6* 8.6* 8.2* 7.8* 7.2*  MG 2.5*  --  2.7*  --   --  2.9*  --  2.6* 2.7*  PHOS  --   --   --   --   --   --   --  7.8* 8.5*   < > = values in this interval not displayed.     ABG: Recent Labs  Lab 01/27/19 0417 01/27/19 0650 01/28/19 0430  PHART 6.960* 7.049* 7.180*  PCO2ART 87.7* 70.0* 50.5*  PO2ART 95.6 78.6* 63.2*  HCO3 18.7* 18.8* 18.6*  O2SAT 95.2 93.3 90.7    Liver Function Tests: Recent Labs  Lab 01/23/19 0543 01/27/19 0654 01/28/19 0812 01/29/19 0642  AST 63* 30  --   --   ALT 23 32  --   --   ALKPHOS 135* 148*  --   --   BILITOT 1.7* 0.7  --   --   PROT 4.7* 4.6*  --   --   ALBUMIN 1.6* 2.3* 2.3* 2.0*   No results for input(s): LIPASE, AMYLASE in the last 168 hours. Recent Labs  Lab 01/25/19 1921  AMMONIA 39*    CBC: Recent Labs  Lab 01/25/19 1820 01/26/19 0625 01/27/19 0654 01/28/19 0812 01/29/19 0642  WBC 29.0* 40.9* 28.2* 22.5* 18.0*  HGB 7.4* 7.0* 7.7* 8.0* 7.5*  HCT 24.1* 22.8* 24.8* 25.6* 23.4*  MCV 95.6 98.7 96.5 92.8 94.7  PLT 177 204 137* 123* 95*    Cardiac Enzymes: Recent Labs  Lab 01/27/19 0654  CKTOTAL 84    BNP (last 3 results) Recent Labs    01/02/19 0613  BNP 604.8*    ProBNP (last 3 results) No results for input(s): PROBNP in the last 8760 hours.  Radiological Exams: Dg Abd Portable 1v  Result Date: 01/27/2019 CLINICAL DATA:  Sepsis EXAM: PORTABLE ABDOMEN - 1 VIEW COMPARISON:  12/19/2018 FINDINGS: Gastrostomy catheter is noted in the stomach. Postsurgical changes are lower lumbar spine bilateral hip joints. No obstructive pattern is seen. Degenerative changes of the lumbar spine noted. IMPRESSION: Gastrostomy catheter in place.  No acute abnormality is noted. Electronically Signed   By: Inez Catalina M.D.   On: 01/27/2019 22:45   Ir Patient Eval Tech 0-60 Mins  Result Date: 01/28/2019 Darnelle Spangle     01/28/2019  3:48 PM Pt brought down for temporary hemodialysis catheter placement and O2 stats dropped after being placed on procedure table and pt became unstable.  Pt sent back upstairs without doing catheter placement.   Assessment/Plan Active Problems:   Acute on chronic  respiratory failure with hypoxia (HCC)   Bacterial lobar pneumonia   Cervical myelopathy with cervical radiculopathy   Altered mental status   Chronic atrial fibrillation   Respiratory failure ([place])   1. Acute on chronic respiratory failure with hypoxia continue full support at this time.  Continue secretion management pulmonary toilet.  Patient's prognosis remains guarded he is currently Levophed drip and receiving a dialysis catheter at this time. 2. Bacterial lobar pneumonia treated continue to monitor 3. Cervical myelopathy unchanged continue supportive care 4. Altered mental status unchanged 5. Chronic atrial fibrillation rate controlled   I have personally seen and evaluated the patient, evaluated laboratory and imaging results, formulated the assessment and plan and placed orders. The Patient requires high complexity decision making for assessment and support.  Case was discussed on Rounds with the Respiratory Therapy Staff  Allyne Gee, MD [facility] Pulmonary Critical Care Medicine Sleep Medicine

## 2019-01-30 ENCOUNTER — Other Ambulatory Visit (HOSPITAL_COMMUNITY): Payer: Self-pay

## 2019-01-30 DIAGNOSIS — J9621 Acute and chronic respiratory failure with hypoxia: Secondary | ICD-10-CM | POA: Diagnosis not present

## 2019-01-30 DIAGNOSIS — R4182 Altered mental status, unspecified: Secondary | ICD-10-CM | POA: Diagnosis not present

## 2019-01-30 DIAGNOSIS — M4712 Other spondylosis with myelopathy, cervical region: Secondary | ICD-10-CM | POA: Diagnosis not present

## 2019-01-30 DIAGNOSIS — J159 Unspecified bacterial pneumonia: Secondary | ICD-10-CM | POA: Diagnosis not present

## 2019-01-30 LAB — BLOOD GAS, ARTERIAL
Acid-base deficit: 7.4 mmol/L — ABNORMAL HIGH (ref 0.0–2.0)
Bicarbonate: 20.4 mmol/L (ref 20.0–28.0)
FIO2: 100
O2 Saturation: 97 %
PEEP: 12 cmH2O
Patient temperature: 98.6
Pressure control: 18 cmH2O
RATE: 22 resp/min
pCO2 arterial: 64 mmHg — ABNORMAL HIGH (ref 32.0–48.0)
pH, Arterial: 7.13 — CL (ref 7.350–7.450)
pO2, Arterial: 109 mmHg — ABNORMAL HIGH (ref 83.0–108.0)

## 2019-01-30 LAB — RENAL FUNCTION PANEL
Albumin: 1.8 g/dL — ABNORMAL LOW (ref 3.5–5.0)
Anion gap: 18 — ABNORMAL HIGH (ref 5–15)
BUN: 130 mg/dL — ABNORMAL HIGH (ref 8–23)
CO2: 21 mmol/L — ABNORMAL LOW (ref 22–32)
Calcium: 7.2 mg/dL — ABNORMAL LOW (ref 8.9–10.3)
Chloride: 99 mmol/L (ref 98–111)
Creatinine, Ser: 2.73 mg/dL — ABNORMAL HIGH (ref 0.61–1.24)
GFR calc Af Amer: 25 mL/min — ABNORMAL LOW (ref >60)
GFR calc non Af Amer: 22 mL/min — ABNORMAL LOW (ref >60)
Glucose, Bld: 197 mg/dL — ABNORMAL HIGH (ref 70–99)
Phosphorus: 7.4 mg/dL — ABNORMAL HIGH (ref 2.5–4.6)
Potassium: 3.4 mmol/L — ABNORMAL LOW (ref 3.5–5.1)
Sodium: 138 mmol/L (ref 135–145)

## 2019-01-30 LAB — HEPATITIS B SURFACE ANTIGEN: Hepatitis B Surface Ag: NEGATIVE

## 2019-01-30 LAB — CBC
HCT: 22.9 % — ABNORMAL LOW (ref 39.0–52.0)
Hemoglobin: 7.2 g/dL — ABNORMAL LOW (ref 13.0–17.0)
MCH: 29.3 pg (ref 26.0–34.0)
MCHC: 31.4 g/dL (ref 30.0–36.0)
MCV: 93.1 fL (ref 80.0–100.0)
Platelets: 63 10*3/uL — ABNORMAL LOW (ref 150–400)
RBC: 2.46 MIL/uL — ABNORMAL LOW (ref 4.22–5.81)
RDW: 18.7 % — ABNORMAL HIGH (ref 11.5–15.5)
WBC: 16.1 10*3/uL — ABNORMAL HIGH (ref 4.0–10.5)
nRBC: 1.8 % — ABNORMAL HIGH (ref 0.0–0.2)

## 2019-01-30 LAB — LACTIC ACID, PLASMA: Lactic Acid, Venous: 2.7 mmol/L (ref 0.5–1.9)

## 2019-01-30 LAB — HEPATITIS B SURFACE ANTIBODY,QUALITATIVE: Hep B S Ab: NONREACTIVE

## 2019-01-30 LAB — HEPATITIS B CORE ANTIBODY, TOTAL: Hep B Core Total Ab: NEGATIVE

## 2019-01-30 LAB — MAGNESIUM: Magnesium: 2.5 mg/dL — ABNORMAL HIGH (ref 1.7–2.4)

## 2019-01-30 NOTE — Progress Notes (Addendum)
Pulmonary Critical Care Medicine [place]   PULMONARY CRITICAL CARE SERVICE  PROGRESS NOTE  Date of Service: 01/30/2019  ABASS MISENER  IRW:[id number]  DOB: [birth year]   DOA: 12/31/2018  Referring Physician: Merton Border, MD  HPI: Roger Nelson is a 78 y.o. male seen for follow up of Acute on Chronic Respiratory Failure.  Patient is now requiring 100% FiO2 on the ventilator on pressure control mode.  Respiratory rate respiratory rate have also been increased.  Patient continues to sat in the mid 90s.  Medications: Reviewed on Rounds  Physical Exam:  Vitals: Pulse 102 respirations 24 BP 140/60 O2 sat 96% temp 97.4  Ventilator Settings ventilator mode AC PC rate of 22 inspiratory pressure 20 PEEP of 10 FiO2 100%  . General: Comfortable at this time . Eyes: Grossly normal lids, irises & conjunctiva . ENT: grossly tongue is normal . Neck: no obvious mass . Cardiovascular: S1 S2 normal no gallop . Respiratory: Coarse breath sounds . Abdomen: soft . Skin: no rash seen on limited exam . Musculoskeletal: not rigid . Psychiatric:unable to assess . Neurologic: no seizure no involuntary movements         Lab Data:   Basic Metabolic Panel: Recent Labs  Lab 01/25/19 0711  01/27/19 0654 01/27/19 1500 01/28/19 0812 01/29/19 0642 01/29/19 1729 01/30/19 0735  NA 131*   < > 137 136 137 138 137 138  K 5.0   < > 4.4 4.1 3.7 3.9 4.0 3.4*  CL 94*   < > 99 100 100 102 102 99  CO2 20*   < > 20* 19* 19* 19* 18* 21*  GLUCOSE 146*   < > 118* 127* 111* 150* 175* 197*  BUN 113*   < > 136* 140* 148* 153* 154* 130*  CREATININE 2.08*   < > 2.62* 2.63* 2.81* 3.04* 2.98* 2.73*  CALCIUM 8.5*   < > 8.6* 8.2* 7.8* 7.2* 7.1* 7.2*  MG 2.7*  --  2.9*  --  2.6* 2.7*  --  2.5*  PHOS  --   --   --   --  7.8* 8.5* 8.6* 7.4*   < > = values in this interval not displayed.    ABG: Recent Labs  Lab 01/27/19 0417 01/27/19 0650 01/28/19 0430 01/30/19 0506  PHART 6.960* 7.049*  7.180* 7.130*  PCO2ART 87.7* 70.0* 50.5* 64.0*  PO2ART 95.6 78.6* 63.2* 109*  HCO3 18.7* 18.8* 18.6* 20.4  O2SAT 95.2 93.3 90.7 97.0    Liver Function Tests: Recent Labs  Lab 01/27/19 0654 01/28/19 0812 01/29/19 0642 01/29/19 1729 01/30/19 0735  AST 30  --   --   --   --   ALT 32  --   --   --   --   ALKPHOS 148*  --   --   --   --   BILITOT 0.7  --   --   --   --   PROT 4.6*  --   --   --   --   ALBUMIN 2.3* 2.3* 2.0* 2.0* 1.8*   No results for input(s): LIPASE, AMYLASE in the last 168 hours. Recent Labs  Lab 01/25/19 1921  AMMONIA 39*    CBC: Recent Labs  Lab 01/27/19 0654 01/28/19 0812 01/29/19 0642 01/29/19 1728 01/30/19 0735  WBC 28.2* 22.5* 18.0* 21.9* 16.1*  HGB 7.7* 8.0* 7.5* 7.9* 7.2*  HCT 24.8* 25.6* 23.4* 25.0* 22.9*  MCV 96.5 92.8 94.7 94.3 93.1  PLT 137* 123*  95* 99* 63*    Cardiac Enzymes: Recent Labs  Lab 01/27/19 0654  CKTOTAL 84    BNP (last 3 results) Recent Labs    01/02/19 0613  BNP 604.8*    ProBNP (last 3 results) No results for input(s): PROBNP in the last 8760 hours.  Radiological Exams: Ir US Guide Vasc Access Right  Result Date: 01/29/2019 INDICATION: 78 year old with respiratory distress and tracheostomy tube. Patient also has acute renal failure and needs a temporary dialysis catheter. Patient cannot be safely move onto the interventional table due to respiratory distress. Plan for placement right jugular central line with ultrasound guidance and will check catheter positioning with chest radiography. EXAM: ULTRASOUND GUIDED PLACEMENT OF A NON-TUNNELED DIALYSIS CATHETER Physician: Stephan Minister. Anselm Pancoast, MD MEDICATIONS: None ANESTHESIA/SEDATION: None FLUOROSCOPY TIME:  None COMPLICATIONS: None immediate. PROCEDURE: Informed consent was obtained for catheter placement. The patient was placed supine on the interventional table. Ultrasound confirmed a patent right internal jugular vein. Ultrasound images were obtained for documentation.  The right side of the neck was prepped and draped in a sterile fashion. The right neck was anesthetized with 1% lidocaine. Maximal barrier sterile technique was utilized including caps, mask, sterile gowns, sterile gloves, sterile drape, hand hygiene and skin antiseptic. A small incision was made with #11 blade scalpel. Needle was directed into the right internal jugular vein with ultrasound guidance. J wire was easily advanced through the needle and into the central venous system. Tract was dilated to accommodate a 16 [place] Mahurkar catheter. Portable chest x-ray demonstrated that the catheter was too short and near the upper SVC. Therefore, the catheter was exchanged over the J-wire for a 20 cm Mahurkar catheter. Lumens aspirated and flushed well. Appropriate amount of heparin was placed in the dialysis lumens. Catheter was sutured to skin. Follow-up chest radiograph was obtained. FINDINGS: Patent right internal jugular vein. Post placement chest radiograph demonstrates the catheter tip in the lower SVC region. No evidence for a large pneumothorax. IMPRESSION: Successful placement of a right jugular non-tunneled dialysis catheter using ultrasound guidance. Electronically Signed   By: Markus Daft M.D.   On: 01/29/2019 14:02   Dg Chest Port 1 View  Result Date: 01/30/2019 CLINICAL DATA:  Pneumonia. EXAM: PORTABLE CHEST 1 VIEW COMPARISON:  One-view chest x-ray 01/29/2019 FINDINGS: Heart is enlarged. Tracheostomy tube is stable. Right IJ sheath is stable. Diffuse interstitial and airspace disease is similar the prior exam. Bibasilar consolidation is noted. IMPRESSION: 1. Similar appearance of diffuse interstitial and airspace disease consistent with multi lobar pneumonia. 2. Tracheostomy is stable. Electronically Signed   By: San Morelle M.D.   On: 01/30/2019 06:55   Dg Chest Port 1 View  Result Date: 01/29/2019 CLINICAL DATA:  Placement of right jugular non tunneled dialysis catheter with ultrasound.  Evaluate catheter tip placement. EXAM: PORTABLE CHEST 1 VIEW COMPARISON:  01/26/2019 FINDINGS: Right jugular central line has been placed. Catheter tip appears to be in the lower SVC. Slightly increased bilateral airspace disease particularly in the left lung. Again noted is a tracheostomy tube. Surgical plate in the lower cervical spine. Heart size remains enlarged. IMPRESSION: Dialysis catheter tip in the lower SVC region. Negative for a large pneumothorax. Bilateral airspace disease, slight progression since 01/26/2019. Electronically Signed   By: Markus Daft M.D.   On: 01/29/2019 14:04    Assessment/Plan Active Problems:   Acute on chronic respiratory failure with hypoxia (HCC)   Bacterial lobar pneumonia   Cervical myelopathy with cervical radiculopathy   Altered mental status  Chronic atrial fibrillation   Respiratory failure ([place])   1. Acute on chronic respiratory failure with hypoxia continue full support at this time attempt to wean oxygen as tolerated.  Continue aggressive pulmonary toilet and secretion management.  Patient's prognosis is poor.  Continue supportive measures 2. Bacterial lobar pneumonia treated continue to monitor 3. Cervical myelopathy unchanged continue supportive care 4. Altered mental status unchanged 5. Chronic atrial fibrillation rate controlled   I have personally seen and evaluated the patient, evaluated laboratory and imaging results, formulated the assessment and plan and placed orders. The Patient requires high complexity decision making for assessment and support.  Case was discussed on Rounds with the Respiratory Therapy Staff  Allyne Gee, MD [facility] Pulmonary Critical Care Medicine Sleep Medicine

## 2019-01-31 DIAGNOSIS — R4182 Altered mental status, unspecified: Secondary | ICD-10-CM | POA: Diagnosis not present

## 2019-01-31 DIAGNOSIS — M4712 Other spondylosis with myelopathy, cervical region: Secondary | ICD-10-CM | POA: Diagnosis not present

## 2019-01-31 DIAGNOSIS — J159 Unspecified bacterial pneumonia: Secondary | ICD-10-CM | POA: Diagnosis not present

## 2019-01-31 DIAGNOSIS — J9621 Acute and chronic respiratory failure with hypoxia: Secondary | ICD-10-CM | POA: Diagnosis not present

## 2019-01-31 LAB — BLOOD GAS, ARTERIAL
Acid-base deficit: 2.3 mmol/L — ABNORMAL HIGH (ref 0.0–2.0)
Bicarbonate: 24.7 mmol/L (ref 20.0–28.0)
Drawn by: 347191
FIO2: 1
O2 Saturation: 97.5 %
PEEP: 10 cmH2O
Patient temperature: 98.6
Pressure control: 20 cmH2O
RATE: 24 resp/min
pCO2 arterial: 65.4 mmHg (ref 32.0–48.0)
pH, Arterial: 7.202 — ABNORMAL LOW (ref 7.350–7.450)
pO2, Arterial: 109 mmHg — ABNORMAL HIGH (ref 83.0–108.0)

## 2019-01-31 LAB — CBC
HCT: 21.5 % — ABNORMAL LOW (ref 39.0–52.0)
Hemoglobin: 7 g/dL — ABNORMAL LOW (ref 13.0–17.0)
MCH: 30.3 pg (ref 26.0–34.0)
MCHC: 32.6 g/dL (ref 30.0–36.0)
MCV: 93.1 fL (ref 80.0–100.0)
Platelets: 43 10*3/uL — ABNORMAL LOW (ref 150–400)
RBC: 2.31 MIL/uL — ABNORMAL LOW (ref 4.22–5.81)
RDW: 18.3 % — ABNORMAL HIGH (ref 11.5–15.5)
WBC: 16.2 10*3/uL — ABNORMAL HIGH (ref 4.0–10.5)
nRBC: 0.9 % — ABNORMAL HIGH (ref 0.0–0.2)

## 2019-01-31 LAB — RENAL FUNCTION PANEL
Albumin: 1.8 g/dL — ABNORMAL LOW (ref 3.5–5.0)
Anion gap: 10 (ref 5–15)
BUN: 91 mg/dL — ABNORMAL HIGH (ref 8–23)
CO2: 30 mmol/L (ref 22–32)
Calcium: 7.2 mg/dL — ABNORMAL LOW (ref 8.9–10.3)
Chloride: 97 mmol/L — ABNORMAL LOW (ref 98–111)
Creatinine, Ser: 2 mg/dL — ABNORMAL HIGH (ref 0.61–1.24)
GFR calc Af Amer: 37 mL/min — ABNORMAL LOW (ref >60)
GFR calc non Af Amer: 32 mL/min — ABNORMAL LOW (ref >60)
Glucose, Bld: 108 mg/dL — ABNORMAL HIGH (ref 70–99)
Phosphorus: 5.4 mg/dL — ABNORMAL HIGH (ref 2.5–4.6)
Potassium: 3.4 mmol/L — ABNORMAL LOW (ref 3.5–5.1)
Sodium: 137 mmol/L (ref 135–145)

## 2019-01-31 LAB — LACTIC ACID, PLASMA: Lactic Acid, Venous: 2.3 mmol/L (ref 0.5–1.9)

## 2019-01-31 LAB — MAGNESIUM: Magnesium: 2.3 mg/dL (ref 1.7–2.4)

## 2019-01-31 LAB — PREPARE RBC (CROSSMATCH)

## 2019-01-31 NOTE — Progress Notes (Addendum)
Pulmonary Critical Care Medicine [place]   PULMONARY CRITICAL CARE SERVICE  PROGRESS NOTE  Date of Service: 01/31/2019  Roger Nelson  IEP:[id number]  DOB: [birth year]   DOA: 01/02/2019  Referring Physician: Merton Border, MD  HPI: Roger Nelson is a 78 y.o. male seen for follow up of Acute on Chronic Respiratory Failure.  Patient continues to be on full support on ventilator.  Requiring 75% FiO2 at this time.  Medications: Reviewed on Rounds  Physical Exam:  Vitals: Pulse 96 respirations 28 BP 117/61 O2 sat 99% temp 97.9  Ventilator Settings ventilator mode AC PC rate of 24 IP 20 PEEP of 10 FiO2 75%  . General: Comfortable at this time . Eyes: Grossly normal lids, irises & conjunctiva . ENT: grossly tongue is normal . Neck: no obvious mass . Cardiovascular: S1 S2 normal no gallop . Respiratory: Coarse breath sounds . Abdomen: soft . Skin: no rash seen on limited exam . Musculoskeletal: not rigid . Psychiatric:unable to assess . Neurologic: no seizure no involuntary movements         Lab Data:   Basic Metabolic Panel: Recent Labs  Lab 01/27/19 0654  01/28/19 0812 01/29/19 0642 01/29/19 1729 01/30/19 0735 01/31/19 0603  NA 137   < > 137 138 137 138 137  K 4.4   < > 3.7 3.9 4.0 3.4* 3.4*  CL 99   < > 100 102 102 99 97*  CO2 20*   < > 19* 19* 18* 21* 30  GLUCOSE 118*   < > 111* 150* 175* 197* 108*  BUN 136*   < > 148* 153* 154* 130* 91*  CREATININE 2.62*   < > 2.81* 3.04* 2.98* 2.73* 2.00*  CALCIUM 8.6*   < > 7.8* 7.2* 7.1* 7.2* 7.2*  MG 2.9*  --  2.6* 2.7*  --  2.5* 2.3  PHOS  --   --  7.8* 8.5* 8.6* 7.4* 5.4*   < > = values in this interval not displayed.    ABG: Recent Labs  Lab 01/27/19 0417 01/27/19 0650 01/28/19 0430 01/30/19 0506 01/31/19 0430  PHART 6.960* 7.049* 7.180* 7.130* 7.202*  PCO2ART 87.7* 70.0* 50.5* 64.0* 65.4*  PO2ART 95.6 78.6* 63.2* 109* 109*  HCO3 18.7* 18.8* 18.6* 20.4 24.7  O2SAT 95.2 93.3 90.7 97.0 97.5     Liver Function Tests: Recent Labs  Lab 01/27/19 0654 01/28/19 0812 01/29/19 0642 01/29/19 1729 01/30/19 0735 01/31/19 0603  AST 30  --   --   --   --   --   ALT 32  --   --   --   --   --   ALKPHOS 148*  --   --   --   --   --   BILITOT 0.7  --   --   --   --   --   PROT 4.6*  --   --   --   --   --   ALBUMIN 2.3* 2.3* 2.0* 2.0* 1.8* 1.8*   No results for input(s): LIPASE, AMYLASE in the last 168 hours. Recent Labs  Lab 01/25/19 1921  AMMONIA 39*    CBC: Recent Labs  Lab 01/28/19 0812 01/29/19 0642 01/29/19 1728 01/30/19 0735 01/31/19 0603  WBC 22.5* 18.0* 21.9* 16.1* 16.2*  HGB 8.0* 7.5* 7.9* 7.2* 7.0*  HCT 25.6* 23.4* 25.0* 22.9* 21.5*  MCV 92.8 94.7 94.3 93.1 93.1  PLT 123* 95* 99* 63* 43*    Cardiac Enzymes: Recent  Labs  Lab 01/27/19 0654  CKTOTAL 84    BNP (last 3 results) Recent Labs    01/02/19 0613  BNP 604.8*    ProBNP (last 3 results) No results for input(s): PROBNP in the last 8760 hours.  Radiological Exams: Dg Chest Port 1 View  Result Date: 01/30/2019 CLINICAL DATA:  Pneumonia. EXAM: PORTABLE CHEST 1 VIEW COMPARISON:  One-view chest x-ray 01/29/2019 FINDINGS: Heart is enlarged. Tracheostomy tube is stable. Right IJ sheath is stable. Diffuse interstitial and airspace disease is similar the prior exam. Bibasilar consolidation is noted. IMPRESSION: 1. Similar appearance of diffuse interstitial and airspace disease consistent with multi lobar pneumonia. 2. Tracheostomy is stable. Electronically Signed   By: San Morelle M.D.   On: 01/30/2019 06:55    Assessment/Plan Active Problems:   Acute on chronic respiratory failure with hypoxia (HCC)   Bacterial lobar pneumonia   Cervical myelopathy with cervical radiculopathy   Altered mental status   Chronic atrial fibrillation   Respiratory failure ([place])   1. Acute on chronic respiratory failure with hypoxia continue full support this time, continue to wean oxygen as tolerated.   Continue pulmonary toilet and secretion management.  Prognosis remains poor we will continue supportive measures 2. Bacterial lobar pneumonia treated continue to monitor 3. Encephalopathy unchanged continue supportive care 4. Altered mental status unchanged 5. Chronic atrial fibrillation rate controlled   I have personally seen and evaluated the patient, evaluated laboratory and imaging results, formulated the assessment and plan and placed orders. The Patient requires high complexity decision making for assessment and support.  Case was discussed on Rounds with the Respiratory Therapy Staff  Allyne Gee, MD [facility] Pulmonary Critical Care Medicine Sleep Medicine

## 2019-01-31 NOTE — Progress Notes (Signed)
[facility]  ROUNDING NOTE   Subjective:  Patient seen at bedside. Metabolic parameters have improved with the initiation of dialysis. Remains critically ill. Due for dialysis again tomorrow.  Objective:  Vital signs in last 24 hours:  Temperature 97.9 pulse 96 respirations 28 blood pressure 117/61  Physical Exam: General: Critically ill-appearing  Head: Normocephalic, atraumatic. Dry oral mucosal membranes  Eyes: Anicteric  Neck: Supple, trachea midline  Lungs:  Scattered rhonchi, increased work of breathing   Heart: S1S2 no rubs  Abdomen:  Soft, nontender, bowel sounds present  Extremities: 3+ peripheral edema.  Neurologic: Not responding to commands  Skin: Weeping skin  Access: Right internal jugular temporary dialysis catheter    Basic Metabolic Panel: Recent Labs  Lab 01/27/19 0654  01/28/19 0812 01/29/19 0642 01/29/19 1729 01/30/19 0735 01/31/19 0603  NA 137   < > 137 138 137 138 137  K 4.4   < > 3.7 3.9 4.0 3.4* 3.4*  CL 99   < > 100 102 102 99 97*  CO2 20*   < > 19* 19* 18* 21* 30  GLUCOSE 118*   < > 111* 150* 175* 197* 108*  BUN 136*   < > 148* 153* 154* 130* 91*  CREATININE 2.62*   < > 2.81* 3.04* 2.98* 2.73* 2.00*  CALCIUM 8.6*   < > 7.8* 7.2* 7.1* 7.2* 7.2*  MG 2.9*  --  2.6* 2.7*  --  2.5* 2.3  PHOS  --   --  7.8* 8.5* 8.6* 7.4* 5.4*   < > = values in this interval not displayed.    Liver Function Tests: Recent Labs  Lab 01/27/19 0654 01/28/19 0938 01/29/19 1829 01/29/19 1729 01/30/19 0735 01/31/19 0603  AST 30  --   --   --   --   --   ALT 32  --   --   --   --   --   ALKPHOS 148*  --   --   --   --   --   BILITOT 0.7  --   --   --   --   --   PROT 4.6*  --   --   --   --   --   ALBUMIN 2.3* 2.3* 2.0* 2.0* 1.8* 1.8*   No results for input(s): LIPASE, AMYLASE in the last 168 hours. Recent Labs  Lab 01/25/19 1921  AMMONIA 39*    CBC: Recent Labs  Lab 01/28/19 0812 01/29/19 0642 01/29/19 1728 01/30/19 0735  01/31/19 0603  WBC 22.5* 18.0* 21.9* 16.1* 16.2*  HGB 8.0* 7.5* 7.9* 7.2* 7.0*  HCT 25.6* 23.4* 25.0* 22.9* 21.5*  MCV 92.8 94.7 94.3 93.1 93.1  PLT 123* 95* 99* 63* 43*    Cardiac Enzymes: Recent Labs  Lab 01/27/19 0654  CKTOTAL 84    BNP: Invalid input(s): POCBNP  CBG: No results for input(s): GLUCAP in the last 168 hours.  Microbiology: Results for orders placed or performed during the hospital encounter of 12/30/2018  Culture, respiratory (non-expectorated)     Status: None   Collection Time: 01/05/2019  6:42 PM  Result Value Ref Range Status   Specimen Description TRACHEAL ASPIRATE  Final   Special Requests NONE  Final   Gram Stain   Final    MODERATE WBC PRESENT, PREDOMINANTLY PMN FEW GRAM NEGATIVE RODS Performed at [facility], [address] [place], [place] [address]    Culture   Final    FEW KLEBSIELLA  PNEUMONIAE FEW ENTEROBACTER CLOACAE    Report Status 01/06/2019 FINAL  Final   Organism ID, Bacteria KLEBSIELLA PNEUMONIAE  Final   Organism ID, Bacteria ENTEROBACTER CLOACAE  Final      Susceptibility   Enterobacter cloacae - MIC*    CEFAZOLIN >=64 RESISTANT Resistant     CEFEPIME 4 SENSITIVE Sensitive     CEFTAZIDIME >=64 RESISTANT Resistant     CEFTRIAXONE >=64 RESISTANT Resistant     CIPROFLOXACIN <=0.25 SENSITIVE Sensitive     GENTAMICIN <=1 SENSITIVE Sensitive     IMIPENEM 0.5 SENSITIVE Sensitive     TRIMETH/SULFA <=20 SENSITIVE Sensitive     PIP/TAZO >=128 RESISTANT Resistant     * FEW ENTEROBACTER CLOACAE   Klebsiella pneumoniae - MIC*    AMPICILLIN >=32 RESISTANT Resistant     CEFAZOLIN >=64 RESISTANT Resistant     CEFEPIME <=1 SENSITIVE Sensitive     CEFTAZIDIME 32 RESISTANT Resistant     CEFTRIAXONE <=1 SENSITIVE Sensitive     CIPROFLOXACIN <=0.25 SENSITIVE Sensitive     GENTAMICIN <=1 SENSITIVE Sensitive     IMIPENEM <=0.25 SENSITIVE Sensitive     TRIMETH/SULFA <=20 SENSITIVE Sensitive     AMPICILLIN/SULBACTAM RESISTANT Resistant      PIP/TAZO >=128 RESISTANT Resistant     Extended ESBL NEGATIVE Sensitive     * FEW KLEBSIELLA PNEUMONIAE  Culture, respiratory (non-expectorated)     Status: None   Collection Time: 01/12/19 11:03 AM  Result Value Ref Range Status   Specimen Description TRACHEAL ASPIRATE  Final   Special Requests NONE  Final   Gram Stain   Final    ABUNDANT WBC PRESENT,BOTH PMN AND MONONUCLEAR RARE SQUAMOUS EPITHELIAL CELLS PRESENT MODERATE GRAM NEGATIVE RODS FEW GRAM POSITIVE COCCI IN PAIRS FEW GRAM POSITIVE RODS Performed at [facility], [place] [address], [place], [place] [address]    Culture   Final    MODERATE ENTEROBACTER CLOACAE MODERATE KLEBSIELLA PNEUMONIAE MODERATE METHICILLIN RESISTANT STAPHYLOCOCCUS AUREUS    Report Status 01/15/2019 FINAL  Final   Organism ID, Bacteria ENTEROBACTER CLOACAE  Final   Organism ID, Bacteria KLEBSIELLA PNEUMONIAE  Final   Organism ID, Bacteria METHICILLIN RESISTANT STAPHYLOCOCCUS AUREUS  Final      Susceptibility   Enterobacter cloacae - MIC*    CEFAZOLIN >=64 RESISTANT Resistant     CEFEPIME 2 SENSITIVE Sensitive     CEFTAZIDIME >=64 RESISTANT Resistant     CEFTRIAXONE >=64 RESISTANT Resistant     CIPROFLOXACIN <=0.25 SENSITIVE Sensitive     GENTAMICIN <=1 SENSITIVE Sensitive     IMIPENEM <=0.25 SENSITIVE Sensitive     TRIMETH/SULFA <=20 SENSITIVE Sensitive     PIP/TAZO >=128 RESISTANT Resistant     * MODERATE ENTEROBACTER CLOACAE   Klebsiella pneumoniae - MIC*    AMPICILLIN >=32 RESISTANT Resistant     CEFAZOLIN <=4 SENSITIVE Sensitive     CEFEPIME <=1 SENSITIVE Sensitive     CEFTAZIDIME <=1 SENSITIVE Sensitive     CEFTRIAXONE <=1 SENSITIVE Sensitive     CIPROFLOXACIN <=0.25 SENSITIVE Sensitive     GENTAMICIN <=1 SENSITIVE Sensitive     IMIPENEM <=0.25 SENSITIVE Sensitive     TRIMETH/SULFA <=20 SENSITIVE Sensitive     AMPICILLIN/SULBACTAM 8 SENSITIVE Sensitive     PIP/TAZO <=4 SENSITIVE Sensitive     Extended ESBL NEGATIVE Sensitive      * MODERATE KLEBSIELLA PNEUMONIAE   Methicillin resistant staphylococcus aureus - MIC*    CIPROFLOXACIN <=0.5 SENSITIVE Sensitive     ERYTHROMYCIN 0.5 SENSITIVE Sensitive  GENTAMICIN <=0.5 SENSITIVE Sensitive     OXACILLIN >=4 RESISTANT Resistant     TETRACYCLINE <=1 SENSITIVE Sensitive     VANCOMYCIN <=0.5 SENSITIVE Sensitive     TRIMETH/SULFA <=10 SENSITIVE Sensitive     CLINDAMYCIN <=0.25 SENSITIVE Sensitive     RIFAMPIN <=0.5 SENSITIVE Sensitive     Inducible Clindamycin NEGATIVE Sensitive     * MODERATE METHICILLIN RESISTANT STAPHYLOCOCCUS AUREUS  Culture, Urine     Status: None   Collection Time: 01/18/19  7:55 AM  Result Value Ref Range Status   Specimen Description URINE, RANDOM  Final   Special Requests NONE  Final   Culture   Final    NO GROWTH Performed at [facility], [place] [address], [place], [place] [address]    Report Status 01/19/2019 FINAL  Final  Culture, respiratory     Status: None   Collection Time: 01/18/19  7:57 AM  Result Value Ref Range Status   Specimen Description TRACHEAL ASPIRATE  Final   Special Requests NONE  Final   Gram Stain   Final    FEW WBC PRESENT, PREDOMINANTLY PMN MODERATE BUDDING YEAST SEEN Performed at [facility], [place] [address] [place], [place] [address]    Culture FEW CANDIDA LUSITANIAE  Final   Report Status 01/20/2019 FINAL  Final  Culture, blood (routine x 2)     Status: None   Collection Time: 01/18/19  8:43 AM  Result Value Ref Range Status   Specimen Description BLOOD LEFT HAND  Final   Special Requests   Final    BOTTLES DRAWN AEROBIC ONLY Blood Culture results may not be optimal due to an inadequate volume of blood received in culture bottles   Culture   Final    NO GROWTH 5 DAYS Performed at [facility], [place] [address], [place], [place] [address]    Report Status 01/23/2019 FINAL  Final  Culture, blood (routine x 2)     Status: Abnormal   Collection Time: 01/18/19  8:52 AM  Result Value  Ref Range Status   Specimen Description BLOOD LEFT HAND  Final   Special Requests   Final    BOTTLES DRAWN AEROBIC ONLY Blood Culture results may not be optimal due to an inadequate volume of blood received in culture bottles   Culture  Setup Time   Final    GRAM POSITIVE COCCI AEROBIC BOTTLE ONLY CRITICAL RESULT CALLED TO, READ BACK BY AND VERIFIED WITH: Furigay RN 11:20 01/20/19 ([email or web address])    Culture (A)  Final    STAPHYLOCOCCUS SPECIES (COAGULASE NEGATIVE) THE SIGNIFICANCE OF ISOLATING THIS ORGANISM FROM A SINGLE SET OF BLOOD CULTURES WHEN MULTIPLE SETS ARE DRAWN IS UNCERTAIN. PLEASE NOTIFY THE MICROBIOLOGY DEPARTMENT WITHIN ONE WEEK IF SPECIATION AND SENSITIVITIES ARE REQUIRED. Performed at [facility], [place] [address], [place], [place] [address]    Report Status 01/22/2019 FINAL  Final  Blood Culture ID Panel (Reflexed)     Status: Abnormal   Collection Time: 01/18/19  8:52 AM  Result Value Ref Range Status   Enterococcus species NOT DETECTED NOT DETECTED Final   Listeria monocytogenes NOT DETECTED NOT DETECTED Final   Staphylococcus species DETECTED (A) NOT DETECTED Final    Comment: Methicillin (oxacillin) resistant coagulase negative staphylococcus. Possible blood culture contaminant (unless isolated from more than one blood culture draw or clinical case suggests pathogenicity). No antibiotic treatment is indicated for blood  culture contaminants. CRITICAL RESULT CALLED TO, READ BACK BY AND  VERIFIED WITH: Furigay RN 11:20 01/20/19 ([email or web address])    Staphylococcus aureus (BCID) NOT DETECTED NOT DETECTED Final   Methicillin resistance DETECTED (A) NOT DETECTED Final    Comment: CRITICAL RESULT CALLED TO, READ BACK BY AND VERIFIED WITH: Furigay RN 11:20 01/20/19 ([email or web address])    Streptococcus species NOT DETECTED NOT DETECTED Final   Streptococcus agalactiae NOT DETECTED NOT DETECTED Final   Streptococcus pneumoniae NOT DETECTED NOT DETECTED Final   Streptococcus pyogenes NOT DETECTED  NOT DETECTED Final   Acinetobacter baumannii NOT DETECTED NOT DETECTED Final   Enterobacteriaceae species NOT DETECTED NOT DETECTED Final   Enterobacter cloacae complex NOT DETECTED NOT DETECTED Final   Escherichia coli NOT DETECTED NOT DETECTED Final   Klebsiella oxytoca NOT DETECTED NOT DETECTED Final   Klebsiella pneumoniae NOT DETECTED NOT DETECTED Final   Proteus species NOT DETECTED NOT DETECTED Final   Serratia marcescens NOT DETECTED NOT DETECTED Final   Haemophilus influenzae NOT DETECTED NOT DETECTED Final   Neisseria meningitidis NOT DETECTED NOT DETECTED Final   Pseudomonas aeruginosa NOT DETECTED NOT DETECTED Final   Candida albicans NOT DETECTED NOT DETECTED Final   Candida glabrata NOT DETECTED NOT DETECTED Final   Candida krusei NOT DETECTED NOT DETECTED Final   Candida parapsilosis NOT DETECTED NOT DETECTED Final   Candida tropicalis NOT DETECTED NOT DETECTED Final    Comment: Performed at [facility], [address], [place], [place] [address]  Culture, blood (routine x 2)     Status: Abnormal (Preliminary result)   Collection Time: 01/25/19  7:15 PM  Result Value Ref Range Status   Specimen Description BLOOD RIGHT ARM  Final   Special Requests   Final    BOTTLES DRAWN AEROBIC ONLY Blood Culture adequate volume   Culture  Setup Time   Final    AEROBIC BOTTLE ONLY YEAST IDENTIFICATION TO FOLLOW CRITICAL RESULT CALLED TO, READ BACK BY AND VERIFIED WITHLorelle Formosa RN 01/28/19 0005 JDW Performed at [facility], [place] [address] [place], [place] [address]    Culture CANDIDA ALBICANS CANDIDA PARAPSILOSIS  (A)  Final   Report Status PENDING  Incomplete  Culture, blood (routine x 2)     Status: Abnormal (Preliminary result)   Collection Time: 01/25/19  7:20 PM  Result Value Ref Range Status   Specimen Description BLOOD RIGHT HAND  Final   Special Requests   Final    BOTTLES DRAWN AEROBIC AND ANAEROBIC Blood Culture adequate volume   Culture  Setup Time    Final    YEAST AEROBIC BOTTLE ONLY CRITICAL VALUE NOTED.  VALUE IS CONSISTENT WITH PREVIOUSLY REPORTED AND CALLED VALUE. Performed at [facility], [place] [address] [place],  [address]    Culture CANDIDA ALBICANS (A)  Final   Report Status PENDING  Incomplete  Blood Culture ID Panel (Reflexed)     Status: Abnormal   Collection Time: 01/25/19  7:21 PM  Result Value Ref Range Status   Enterococcus species NOT DETECTED NOT DETECTED Final   Listeria monocytogenes NOT DETECTED NOT DETECTED Final   Staphylococcus species NOT DETECTED NOT DETECTED Final   Staphylococcus aureus (BCID) NOT DETECTED NOT DETECTED Final   Streptococcus species NOT DETECTED NOT DETECTED Final   Streptococcus agalactiae NOT DETECTED NOT DETECTED Final   Streptococcus pneumoniae NOT DETECTED NOT DETECTED Final   Streptococcus pyogenes NOT DETECTED NOT DETECTED Final   Acinetobacter baumannii NOT DETECTED NOT DETECTED Final   Enterobacteriaceae species NOT DETECTED NOT DETECTED Final  Enterobacter cloacae complex NOT DETECTED NOT DETECTED Final   Escherichia coli NOT DETECTED NOT DETECTED Final   Klebsiella oxytoca NOT DETECTED NOT DETECTED Final   Klebsiella pneumoniae NOT DETECTED NOT DETECTED Final   Proteus species NOT DETECTED NOT DETECTED Final   Serratia marcescens NOT DETECTED NOT DETECTED Final   Haemophilus influenzae NOT DETECTED NOT DETECTED Final   Neisseria meningitidis NOT DETECTED NOT DETECTED Final   Pseudomonas aeruginosa NOT DETECTED NOT DETECTED Final   Candida albicans DETECTED (A) NOT DETECTED Final    Comment: CRITICAL RESULT CALLED TO, READ BACK BY AND VERIFIED WITH: C MASEKO RN 01/28/19 0005 JDW    Candida glabrata NOT DETECTED NOT DETECTED Final   Candida krusei NOT DETECTED NOT DETECTED Final   Candida parapsilosis DETECTED (A) NOT DETECTED Final    Comment: CRITICAL RESULT CALLED TO, READ BACK BY AND VERIFIED WITH: C MASEKO RN 01/28/19 0005 JDW    Candida tropicalis NOT  DETECTED NOT DETECTED Final    Comment: Performed at [facility], [place] [address] [place], [place] [address]  Respiratory Panel by PCR     Status: None   Collection Time: 01/26/19 12:28 PM  Result Value Ref Range Status   Adenovirus NOT DETECTED NOT DETECTED Final   Coronavirus 229E NOT DETECTED NOT DETECTED Final    Comment: (NOTE) The Coronavirus on the Respiratory Panel, DOES NOT test for the novel  Coronavirus (2019 nCoV)    Coronavirus HKU1 NOT DETECTED NOT DETECTED Final   Coronavirus NL63 NOT DETECTED NOT DETECTED Final   Coronavirus OC43 NOT DETECTED NOT DETECTED Final   Metapneumovirus NOT DETECTED NOT DETECTED Final   Rhinovirus / Enterovirus NOT DETECTED NOT DETECTED Final   Influenza A NOT DETECTED NOT DETECTED Final   Influenza B NOT DETECTED NOT DETECTED Final   Parainfluenza Virus 1 NOT DETECTED NOT DETECTED Final   Parainfluenza Virus 2 NOT DETECTED NOT DETECTED Final   Parainfluenza Virus 3 NOT DETECTED NOT DETECTED Final   Parainfluenza Virus 4 NOT DETECTED NOT DETECTED Final   Respiratory Syncytial Virus NOT DETECTED NOT DETECTED Final   Bordetella pertussis NOT DETECTED NOT DETECTED Final   Chlamydophila pneumoniae NOT DETECTED NOT DETECTED Final   Mycoplasma pneumoniae NOT DETECTED NOT DETECTED Final    Comment: Performed at [facility], [place]. [address] [place], [place] [address]  SARS Coronavirus 2 [facility] order, Performed in [facility] hospital lab)     Status: None   Collection Time: 01/27/19  9:25 PM  Result Value Ref Range Status   SARS Coronavirus 2 NEGATIVE NEGATIVE Final    Comment: (NOTE) If result is NEGATIVE SARS-CoV-2 target nucleic acids are NOT DETECTED. The SARS-CoV-2 RNA is generally detectable in upper and lower  respiratory specimens during the acute phase of infection. The lowest  concentration of SARS-CoV-2 viral copies this assay can detect is 250  copies / mL. A negative result does not preclude SARS-CoV-2 infection   and should not be used as the sole basis for treatment or other  patient management decisions.  A negative result may occur with  improper specimen collection / handling, submission of specimen other  than nasopharyngeal swab, presence of viral mutation(s) within the  areas targeted by this assay, and inadequate number of viral copies  (<250 copies / mL). A negative result must be combined with clinical  observations, patient history, and epidemiological information. If result is POSITIVE SARS-CoV-2 target nucleic acids are DETECTED. The SARS-CoV-2 RNA is generally detectable  in upper and lower  respiratory specimens dur ing the acute phase of infection.  Positive  results are indicative of active infection with SARS-CoV-2.  Clinical  correlation with patient history and other diagnostic information is  necessary to determine patient infection status.  Positive results do  not rule out bacterial infection or co-infection with other viruses. If result is PRESUMPTIVE POSTIVE SARS-CoV-2 nucleic acids MAY BE PRESENT.   A presumptive positive result was obtained on the submitted specimen  and confirmed on repeat testing.  While 2019 novel coronavirus  (SARS-CoV-2) nucleic acids may be present in the submitted sample  additional confirmatory testing may be necessary for epidemiological  and / or clinical management purposes  to differentiate between  SARS-CoV-2 and other Sarbecovirus currently known to infect humans.  If clinically indicated additional testing with an alternate test  methodology [phone]) is advised. The SARS-CoV-2 RNA is generally  detectable in upper and lower respiratory sp ecimens during the acute  phase of infection. The expected result is Negative. Fact Sheet for Patients:  StrictlyIdeas.no Fact Sheet for Healthcare Providers: BankingDealers.co.za This test is not yet approved or cleared by the [place] FDA  and has been authorized for detection and/or diagnosis of SARS-CoV-2 by FDA under an Emergency Use Authorization (EUA).  This EUA will remain in effect (meaning this test can be used) for the duration of the COVID-19 declaration under Section 564(b)(1) of the Act, 21 U.S.C. section 360bbb-3(b)(1), unless the authorization is terminated or revoked sooner. Performed at [facility], [place] [address], [place], [place] [address]   C difficile quick scan w PCR reflex     Status: None   Collection Time: 01/28/19  1:29 PM  Result Value Ref Range Status   C Diff antigen NEGATIVE NEGATIVE Final   C Diff toxin NEGATIVE NEGATIVE Final   C Diff interpretation No C. difficile detected.  Final    Comment: Performed at [facility], [place] [address] [place], [place] [address]    Coagulation Studies: No results for input(s): LABPROT, INR in the last 72 hours.  Urinalysis: No results for input(s): COLORURINE, LABSPEC, PHURINE, GLUCOSEU, HGBUR, BILIRUBINUR, KETONESUR, PROTEINUR, UROBILINOGEN, NITRITE, LEUKOCYTESUR in the last 72 hours.  Invalid input(s): APPERANCEUR    Imaging: Dg Chest Port 1 View  Result Date: 01/30/2019 CLINICAL DATA:  Pneumonia. EXAM: PORTABLE CHEST 1 VIEW COMPARISON:  One-view chest x-ray 01/29/2019 FINDINGS: Heart is enlarged. Tracheostomy tube is stable. Right IJ sheath is stable. Diffuse interstitial and airspace disease is similar the prior exam. Bibasilar consolidation is noted. IMPRESSION: 1. Similar appearance of diffuse interstitial and airspace disease consistent with multi lobar pneumonia. 2. Tracheostomy is stable. Electronically Signed   By: San Morelle M.D.   On: 01/30/2019 06:55     Medications:       Assessment/ Plan:  78 y.o. male with a PMHx of atrial fibrillation, right eye melanoma, chronic back pain, depression, diabetes mellitus type 2, GERD, hypertension, obstructive sleep apnea, who was admitted to [facility] on 12/28/2018 for on going  treatment of acute respiratory failure.  1.  Acute renal failure, suspect multifactorial with contributions from diuretic usage. 2.  Acute respiratory failure. 3.  Metabolic/respiratory acidosis. 4.  Anemia unspecified.  Plan: Metabolic parameters have improved since initiation of dialysis.  pH updated 7.2.  PCO2 still a bit high at 65.4.  Continue ventilatory support.  Next dialysis treatment will be tomorrow.  Ultrafiltration target tomorrow will be 1 kg.  Hemoglobin also low at  7.0.  Consider blood transfusion if hemoglobin drops any further.  Patient continues to have guarded prognosis.   LOS: 0 Rhylee Pucillo 4/17/20203:31 PM

## 2019-02-01 DIAGNOSIS — J9621 Acute and chronic respiratory failure with hypoxia: Secondary | ICD-10-CM | POA: Diagnosis not present

## 2019-02-01 DIAGNOSIS — J159 Unspecified bacterial pneumonia: Secondary | ICD-10-CM | POA: Diagnosis not present

## 2019-02-01 DIAGNOSIS — R4182 Altered mental status, unspecified: Secondary | ICD-10-CM | POA: Diagnosis not present

## 2019-02-01 DIAGNOSIS — M4712 Other spondylosis with myelopathy, cervical region: Secondary | ICD-10-CM | POA: Diagnosis not present

## 2019-02-01 LAB — CBC
HCT: 23.1 % — ABNORMAL LOW (ref 39.0–52.0)
Hemoglobin: 7.7 g/dL — ABNORMAL LOW (ref 13.0–17.0)
MCH: 29.8 pg (ref 26.0–34.0)
MCHC: 33.3 g/dL (ref 30.0–36.0)
MCV: 89.5 fL (ref 80.0–100.0)
Platelets: UNDETERMINED 10*3/uL (ref 150–400)
RBC: 2.58 MIL/uL — ABNORMAL LOW (ref 4.22–5.81)
RDW: 17.4 % — ABNORMAL HIGH (ref 11.5–15.5)
WBC: 14.4 10*3/uL — ABNORMAL HIGH (ref 4.0–10.5)
nRBC: 1 % — ABNORMAL HIGH (ref 0.0–0.2)

## 2019-02-01 LAB — BPAM RBC
Blood Product Expiration Date: 202004302359
ISSUE DATE / TIME: 202004172333
Unit Type and Rh: 5100

## 2019-02-01 LAB — RENAL FUNCTION PANEL
Albumin: 1.7 g/dL — ABNORMAL LOW (ref 3.5–5.0)
Anion gap: 16 — ABNORMAL HIGH (ref 5–15)
BUN: 104 mg/dL — ABNORMAL HIGH (ref 8–23)
CO2: 24 mmol/L (ref 22–32)
Calcium: 7.3 mg/dL — ABNORMAL LOW (ref 8.9–10.3)
Chloride: 99 mmol/L (ref 98–111)
Creatinine, Ser: 2.38 mg/dL — ABNORMAL HIGH (ref 0.61–1.24)
GFR calc Af Amer: 30 mL/min — ABNORMAL LOW (ref >60)
GFR calc non Af Amer: 26 mL/min — ABNORMAL LOW (ref >60)
Glucose, Bld: 148 mg/dL — ABNORMAL HIGH (ref 70–99)
Phosphorus: 6.2 mg/dL — ABNORMAL HIGH (ref 2.5–4.6)
Potassium: 3.4 mmol/L — ABNORMAL LOW (ref 3.5–5.1)
Sodium: 139 mmol/L (ref 135–145)

## 2019-02-01 LAB — BLOOD GAS, ARTERIAL
Acid-base deficit: 2.8 mmol/L — ABNORMAL HIGH (ref 0.0–2.0)
Bicarbonate: 24.4 mmol/L (ref 20.0–28.0)
FIO2: 0.75
O2 Saturation: 95 %
PEEP: 10 cmH2O
Patient temperature: 98.6
RATE: 24 resp/min
pCO2 arterial: 66.6 mmHg (ref 32.0–48.0)
pH, Arterial: 7.189 — CL (ref 7.350–7.450)
pO2, Arterial: 85.9 mmHg (ref 83.0–108.0)

## 2019-02-01 LAB — TYPE AND SCREEN
ABO/RH(D): O POS
Antibody Screen: NEGATIVE
Unit division: 0

## 2019-02-01 LAB — LACTIC ACID, PLASMA: Lactic Acid, Venous: 2.5 mmol/L (ref 0.5–1.9)

## 2019-02-01 LAB — CULTURE, BLOOD (ROUTINE X 2)
Special Requests: ADEQUATE
Special Requests: ADEQUATE

## 2019-02-01 LAB — MAGNESIUM: Magnesium: 2.4 mg/dL (ref 1.7–2.4)

## 2019-02-01 LAB — D-DIMER, QUANTITATIVE: D-Dimer, Quant: 9.36 ug/mL-FEU — ABNORMAL HIGH (ref 0.00–0.50)

## 2019-02-01 LAB — HEPARIN INDUCED PLATELET AB (HIT ANTIBODY): Heparin Induced Plt Ab: 0.137 OD (ref 0.000–0.400)

## 2019-02-01 NOTE — Progress Notes (Addendum)
Pulmonary Critical Care Medicine [place]   PULMONARY CRITICAL CARE SERVICE  PROGRESS NOTE  Date of Service: 02/01/2019  Roger Nelson  NOI:[id number]  DOB: 1941/07/23   DOA: 12/21/2018  Referring Physician: Merton Border, MD  HPI: Roger Nelson is a 78 y.o. male seen for follow up of Acute on Chronic Respiratory Failure.  Patient remains on full support on ventilator.  FiO2 continues to be 75%.  Patient is receiving dialysis today.  Medications: Reviewed on Rounds  Physical Exam:  Vitals: Pulse 85 respirations 24 BP 148/78 O2 sat 96% temp 97.3  Ventilator Settings ventilator mode AC PC rate of 30, respiratory pressure 20 PEEP of 10 FiO2 75%  . General: Comfortable at this time . Eyes: Grossly normal lids, irises & conjunctiva . ENT: grossly tongue is normal . Neck: no obvious mass . Cardiovascular: S1 S2 normal no gallop . Respiratory: Coarse breath sounds . Abdomen: soft . Skin: no rash seen on limited exam . Musculoskeletal: not rigid . Psychiatric:unable to assess . Neurologic: no seizure no involuntary movements         Lab Data:   Basic Metabolic Panel: Recent Labs  Lab 01/28/19 0812 01/29/19 0642 01/29/19 1729 01/30/19 0735 01/31/19 0603 02/01/19 1125  NA 137 138 137 138 137 139  K 3.7 3.9 4.0 3.4* 3.4* 3.4*  CL 100 102 102 99 97* 99  CO2 19* 19* 18* 21* 30 24  GLUCOSE 111* 150* 175* 197* 108* 148*  BUN 148* 153* 154* 130* 91* 104*  CREATININE 2.81* 3.04* 2.98* 2.73* 2.00* 2.38*  CALCIUM 7.8* 7.2* 7.1* 7.2* 7.2* 7.3*  MG 2.6* 2.7*  --  2.5* 2.3 2.4  PHOS 7.8* 8.5* 8.6* 7.4* 5.4* 6.2*    ABG: Recent Labs  Lab 01/27/19 0650 01/28/19 0430 01/30/19 0506 01/31/19 0430 02/01/19 0415  PHART 7.049* 7.180* 7.130* 7.202* 7.189*  PCO2ART 70.0* 50.5* 64.0* 65.4* 66.6*  PO2ART 78.6* 63.2* 109* 109* 85.9  HCO3 18.8* 18.6* 20.4 24.7 24.4  O2SAT 93.3 90.7 97.0 97.5 95.0    Liver Function Tests: Recent Labs  Lab 01/27/19 0654   01/29/19 0642 01/29/19 1729 01/30/19 0735 01/31/19 0603 02/01/19 1125  AST 30  --   --   --   --   --   --   ALT 32  --   --   --   --   --   --   ALKPHOS 148*  --   --   --   --   --   --   BILITOT 0.7  --   --   --   --   --   --   PROT 4.6*  --   --   --   --   --   --   ALBUMIN 2.3*   < > 2.0* 2.0* 1.8* 1.8* 1.7*   < > = values in this interval not displayed.   No results for input(s): LIPASE, AMYLASE in the last 168 hours. Recent Labs  Lab 01/25/19 1921  AMMONIA 39*    CBC: Recent Labs  Lab 01/29/19 0642 01/29/19 1728 01/30/19 0735 01/31/19 0603 02/01/19 1125  WBC 18.0* 21.9* 16.1* 16.2* 14.4*  HGB 7.5* 7.9* 7.2* 7.0* 7.7*  HCT 23.4* 25.0* 22.9* 21.5* 23.1*  MCV 94.7 94.3 93.1 93.1 89.5  PLT 95* 99* 63* 43* PLATELET CLUMPS NOTED ON SMEAR, UNABLE TO ESTIMATE    Cardiac Enzymes: Recent Labs  Lab 01/27/19 0654  CKTOTAL 84  BNP (last 3 results) Recent Labs    01/02/19 0613  BNP 604.8*    ProBNP (last 3 results) No results for input(s): PROBNP in the last 8760 hours.  Radiological Exams: No results found.  Assessment/Plan Active Problems:   Acute on chronic respiratory failure with hypoxia (HCC)   Bacterial lobar pneumonia   Cervical myelopathy with cervical radiculopathy   Altered mental status   Chronic atrial fibrillation   Respiratory failure ([place])   1. Acute on chronic respiratory with hypoxia continue full support this time and wean oxygen as tolerated.  Continue secretion management and aggressive pulmonary toilet.  Patient's prognosis remains poor as he still requires a increased amount of FiO2 to the ventilator. 2. Bacterial lobar ammonia treated continue to monitor 3. Encephalopathy unchanged continue supportive care 4. Mental status unchanged 5. Chronic atrial fibrillation rate controlled   I have personally seen and evaluated the patient, evaluated laboratory and imaging results, formulated the assessment and plan and placed  orders. The Patient requires high complexity decision making for assessment and support.  Case was discussed on Rounds with the Respiratory Therapy Staff  Allyne Gee, MD [facility] Pulmonary Critical Care Medicine Sleep Medicine

## 2019-02-02 DIAGNOSIS — J159 Unspecified bacterial pneumonia: Secondary | ICD-10-CM | POA: Diagnosis not present

## 2019-02-02 DIAGNOSIS — M4712 Other spondylosis with myelopathy, cervical region: Secondary | ICD-10-CM | POA: Diagnosis not present

## 2019-02-02 DIAGNOSIS — R4182 Altered mental status, unspecified: Secondary | ICD-10-CM | POA: Diagnosis not present

## 2019-02-02 DIAGNOSIS — J9621 Acute and chronic respiratory failure with hypoxia: Secondary | ICD-10-CM | POA: Diagnosis not present

## 2019-02-02 LAB — BASIC METABOLIC PANEL
Anion gap: 13 (ref 5–15)
BUN: 90 mg/dL — ABNORMAL HIGH (ref 8–23)
CO2: 28 mmol/L (ref 22–32)
Calcium: 7.1 mg/dL — ABNORMAL LOW (ref 8.9–10.3)
Chloride: 98 mmol/L (ref 98–111)
Creatinine, Ser: 2.11 mg/dL — ABNORMAL HIGH (ref 0.61–1.24)
GFR calc Af Amer: 34 mL/min — ABNORMAL LOW (ref >60)
GFR calc non Af Amer: 30 mL/min — ABNORMAL LOW (ref >60)
Glucose, Bld: 124 mg/dL — ABNORMAL HIGH (ref 70–99)
Potassium: 3.1 mmol/L — ABNORMAL LOW (ref 3.5–5.1)
Sodium: 139 mmol/L (ref 135–145)

## 2019-02-02 LAB — CBC
HCT: 21.8 % — ABNORMAL LOW (ref 39.0–52.0)
Hemoglobin: 7.2 g/dL — ABNORMAL LOW (ref 13.0–17.0)
MCH: 29.6 pg (ref 26.0–34.0)
MCHC: 33 g/dL (ref 30.0–36.0)
MCV: 89.7 fL (ref 80.0–100.0)
Platelets: 20 10*3/uL — CL (ref 150–400)
RBC: 2.43 MIL/uL — ABNORMAL LOW (ref 4.22–5.81)
RDW: 17.4 % — ABNORMAL HIGH (ref 11.5–15.5)
WBC: 17.6 10*3/uL — ABNORMAL HIGH (ref 4.0–10.5)
nRBC: 0.9 % — ABNORMAL HIGH (ref 0.0–0.2)

## 2019-02-02 LAB — BLOOD GAS, ARTERIAL
Acid-Base Excess: 0.4 mmol/L (ref 0.0–2.0)
Bicarbonate: 26.6 mmol/L (ref 20.0–28.0)
FIO2: 100
O2 Saturation: 97.7 %
PEEP: 10 cmH2O
Patient temperature: 98.6
Pressure control: 20 cmH2O
RATE: 30 resp/min
pCO2 arterial: 60.8 mmHg — ABNORMAL HIGH (ref 32.0–48.0)
pH, Arterial: 7.264 — ABNORMAL LOW (ref 7.350–7.450)
pO2, Arterial: 111 mmHg — ABNORMAL HIGH (ref 83.0–108.0)

## 2019-02-02 LAB — LACTIC ACID, PLASMA: Lactic Acid, Venous: 2.6 mmol/L (ref 0.5–1.9)

## 2019-02-02 NOTE — Progress Notes (Addendum)
Pulmonary Critical Care Medicine [place]   PULMONARY CRITICAL CARE SERVICE  PROGRESS NOTE  Date of Service: 02/02/2019  Roger Nelson  RWE:[id number]  DOB: 1941-10-12   DOA: 01/02/2019  Referring Physician: Merton Border, MD  HPI: Roger Nelson is a 78 y.o. male seen for follow up of Acute on Chronic Respiratory Failure.  Patient remains on full support at this time.  Requiring 100% FiO2.  Prognosis remains poor.  Medications: Reviewed on Rounds  Physical Exam:  Vitals: Pulse 99 respirations 32 BP 135/69 O2 sat 100% temp 97.7  Ventilator Settings ventilator mode AC PC rate of 30 IP 20 PEEP of 10 FiO2 100%  . General: Comfortable at this time . Eyes: Grossly normal lids, irises & conjunctiva . ENT: grossly tongue is normal . Neck: no obvious mass . Cardiovascular: S1 S2 normal no gallop . Respiratory: Coarse breath sounds . Abdomen: soft . Skin: no rash seen on limited exam . Musculoskeletal: not rigid . Psychiatric:unable to assess . Neurologic: no seizure no involuntary movements         Lab Data:   Basic Metabolic Panel: Recent Labs  Lab 01/28/19 0812 01/29/19 0642 01/29/19 1729 01/30/19 0735 01/31/19 0603 02/01/19 1125 02/02/19 0530  NA 137 138 137 138 137 139 139  K 3.7 3.9 4.0 3.4* 3.4* 3.4* 3.1*  CL 100 102 102 99 97* 99 98  CO2 19* 19* 18* 21* 30 24 28   GLUCOSE 111* 150* 175* 197* 108* 148* 124*  BUN 148* 153* 154* 130* 91* 104* 90*  CREATININE 2.81* 3.04* 2.98* 2.73* 2.00* 2.38* 2.11*  CALCIUM 7.8* 7.2* 7.1* 7.2* 7.2* 7.3* 7.1*  MG 2.6* 2.7*  --  2.5* 2.3 2.4  --   PHOS 7.8* 8.5* 8.6* 7.4* 5.4* 6.2*  --     ABG: Recent Labs  Lab 01/28/19 0430 01/30/19 0506 01/31/19 0430 02/01/19 0415 02/02/19 0600  PHART 7.180* 7.130* 7.202* 7.189* 7.264*  PCO2ART 50.5* 64.0* 65.4* 66.6* 60.8*  PO2ART 63.2* 109* 109* 85.9 111*  HCO3 18.6* 20.4 24.7 24.4 26.6  O2SAT 90.7 97.0 97.5 95.0 97.7    Liver Function Tests: Recent Labs  Lab  01/27/19 0654  01/29/19 0642 01/29/19 1729 01/30/19 0735 01/31/19 0603 02/01/19 1125  AST 30  --   --   --   --   --   --   ALT 32  --   --   --   --   --   --   ALKPHOS 148*  --   --   --   --   --   --   BILITOT 0.7  --   --   --   --   --   --   PROT 4.6*  --   --   --   --   --   --   ALBUMIN 2.3*   < > 2.0* 2.0* 1.8* 1.8* 1.7*   < > = values in this interval not displayed.   No results for input(s): LIPASE, AMYLASE in the last 168 hours. No results for input(s): AMMONIA in the last 168 hours.  CBC: Recent Labs  Lab 01/29/19 1728 01/30/19 0735 01/31/19 0603 02/01/19 1125 02/02/19 0530  WBC 21.9* 16.1* 16.2* 14.4* 17.6*  HGB 7.9* 7.2* 7.0* 7.7* 7.2*  HCT 25.0* 22.9* 21.5* 23.1* 21.8*  MCV 94.3 93.1 93.1 89.5 89.7  PLT 99* 63* 43* PLATELET CLUMPS NOTED ON SMEAR, UNABLE TO ESTIMATE 20*    Cardiac Enzymes:  Recent Labs  Lab 01/27/19 0654  CKTOTAL 84    BNP (last 3 results) Recent Labs    01/02/19 0613  BNP 604.8*    ProBNP (last 3 results) No results for input(s): PROBNP in the last 8760 hours.  Radiological Exams: No results found.  Assessment/Plan Active Problems:   Acute on chronic respiratory failure with hypoxia (HCC)   Bacterial lobar pneumonia   Cervical myelopathy with cervical radiculopathy   Altered mental status   Chronic atrial fibrillation   Respiratory failure ([place])   1. Acute chronic respiratory failure with hypoxia continue full support at this time and wean oxygen as tolerated.  Continue pulmonary toilet and secretion management.  Prognosis remains poor at this time patient continues to require 100% O2. 2. Bacterial lobar pneumonia treated continue to monitor 3. Encephalopathy unchanged continue supportive care 4. Mental status unchanged 5. Chronic atrial fibrillation rate controlled   I have personally seen and evaluated the patient, evaluated laboratory and imaging results, formulated the assessment and plan and placed  orders. The Patient requires high complexity decision making for assessment and support.  Case was discussed on Rounds with the Respiratory Therapy Staff  Allyne Gee, MD [facility] Pulmonary Critical Care Medicine Sleep Medicine

## 2019-02-03 ENCOUNTER — Other Ambulatory Visit (HOSPITAL_COMMUNITY): Payer: Self-pay

## 2019-02-03 DIAGNOSIS — J9621 Acute and chronic respiratory failure with hypoxia: Secondary | ICD-10-CM | POA: Diagnosis not present

## 2019-02-03 DIAGNOSIS — J159 Unspecified bacterial pneumonia: Secondary | ICD-10-CM | POA: Diagnosis not present

## 2019-02-03 DIAGNOSIS — M4712 Other spondylosis with myelopathy, cervical region: Secondary | ICD-10-CM | POA: Diagnosis not present

## 2019-02-03 DIAGNOSIS — R4182 Altered mental status, unspecified: Secondary | ICD-10-CM | POA: Diagnosis not present

## 2019-02-03 LAB — COMPREHENSIVE METABOLIC PANEL
ALT: 24 U/L (ref 0–44)
AST: 38 U/L (ref 15–41)
Albumin: 1.8 g/dL — ABNORMAL LOW (ref 3.5–5.0)
Alkaline Phosphatase: 192 U/L — ABNORMAL HIGH (ref 38–126)
Anion gap: 16 — ABNORMAL HIGH (ref 5–15)
BUN: 96 mg/dL — ABNORMAL HIGH (ref 8–23)
CO2: 30 mmol/L (ref 22–32)
Calcium: 7.1 mg/dL — ABNORMAL LOW (ref 8.9–10.3)
Chloride: 95 mmol/L — ABNORMAL LOW (ref 98–111)
Creatinine, Ser: 2.44 mg/dL — ABNORMAL HIGH (ref 0.61–1.24)
GFR calc Af Amer: 29 mL/min — ABNORMAL LOW (ref >60)
GFR calc non Af Amer: 25 mL/min — ABNORMAL LOW (ref >60)
Glucose, Bld: 141 mg/dL — ABNORMAL HIGH (ref 70–99)
Potassium: 3.7 mmol/L (ref 3.5–5.1)
Sodium: 141 mmol/L (ref 135–145)
Total Bilirubin: 1 mg/dL (ref 0.3–1.2)
Total Protein: 4.1 g/dL — ABNORMAL LOW (ref 6.5–8.1)

## 2019-02-03 LAB — CBC
HCT: 22.5 % — ABNORMAL LOW (ref 39.0–52.0)
Hemoglobin: 7 g/dL — ABNORMAL LOW (ref 13.0–17.0)
MCH: 28.7 pg (ref 26.0–34.0)
MCHC: 31.1 g/dL (ref 30.0–36.0)
MCV: 92.2 fL (ref 80.0–100.0)
Platelets: 21 10*3/uL — CL (ref 150–400)
RBC: 2.44 MIL/uL — ABNORMAL LOW (ref 4.22–5.81)
RDW: 17.4 % — ABNORMAL HIGH (ref 11.5–15.5)
WBC: 22.6 10*3/uL — ABNORMAL HIGH (ref 4.0–10.5)
nRBC: 0.7 % — ABNORMAL HIGH (ref 0.0–0.2)

## 2019-02-03 LAB — LACTIC ACID, PLASMA: Lactic Acid, Venous: 2.6 mmol/L (ref 0.5–1.9)

## 2019-02-03 LAB — PREPARE RBC (CROSSMATCH)

## 2019-02-03 LAB — PROTIME-INR
INR: 1.2 (ref 0.8–1.2)
Prothrombin Time: 15.4 seconds — ABNORMAL HIGH (ref 11.4–15.2)

## 2019-02-03 NOTE — Progress Notes (Signed)
[facility]  ROUNDING NOTE   Subjective:  Patient remains critically ill appearing. In addition noted to be thrombocytopenic. With 10-20,000. Heparin-induced antibodies negative.  Objective:  Vital signs in last 24 hours:  Temperature 97.5 pulse 91 respirations 20 blood pressure 137/68  Physical Exam: General: Critically ill-appearing  Head: Normocephalic, atraumatic. Dry oral mucosal membranes  Eyes: Anicteric  Neck: Supple, trachea midline  Lungs:  Scattered rhonchi, increased work of breathing   Heart: S1S2 no rubs  Abdomen:  Soft, nontender, bowel sounds present  Extremities: 3+ peripheral edema.  Neurologic: Not responding to commands  Skin: Weeping skin  Access: Right internal jugular temporary dialysis catheter    Basic Metabolic Panel: Recent Labs  Lab 01/28/19 0812 01/29/19 0642 01/29/19 1729 01/30/19 0735 01/31/19 0603 02/01/19 1125 02/02/19 0530 02/03/19 0724  NA 137 138 137 138 137 139 139 141  K 3.7 3.9 4.0 3.4* 3.4* 3.4* 3.1* 3.7  CL 100 102 102 99 97* 99 98 95*  CO2 19* 19* 18* 21* 30 24 28 30   GLUCOSE 111* 150* 175* 197* 108* 148* 124* 141*  BUN 148* 153* 154* 130* 91* 104* 90* 96*  CREATININE 2.81* 3.04* 2.98* 2.73* 2.00* 2.38* 2.11* 2.44*  CALCIUM 7.8* 7.2* 7.1* 7.2* 7.2* 7.3* 7.1* 7.1*  MG 2.6* 2.7*  --  2.5* 2.3 2.4  --   --   PHOS 7.8* 8.5* 8.6* 7.4* 5.4* 6.2*  --   --     Liver Function Tests: Recent Labs  Lab 01/29/19 1729 01/30/19 0735 01/31/19 0603 02/01/19 1125 02/03/19 0724  AST  --   --   --   --  38  ALT  --   --   --   --  24  ALKPHOS  --   --   --   --  192*  BILITOT  --   --   --   --  1.0  PROT  --   --   --   --  4.1*  ALBUMIN 2.0* 1.8* 1.8* 1.7* 1.8*   No results for input(s): LIPASE, AMYLASE in the last 168 hours. No results for input(s): AMMONIA in the last 168 hours.  CBC: Recent Labs  Lab 01/29/19 1728 01/30/19 0735 01/31/19 0603 02/01/19 1125 02/02/19 0530  WBC 21.9* 16.1* 16.2* 14.4*  17.6*  HGB 7.9* 7.2* 7.0* 7.7* 7.2*  HCT 25.0* 22.9* 21.5* 23.1* 21.8*  MCV 94.3 93.1 93.1 89.5 89.7  PLT 99* 63* 43* PLATELET CLUMPS NOTED ON SMEAR, UNABLE TO ESTIMATE 20*    Cardiac Enzymes: No results for input(s): CKTOTAL, CKMB, CKMBINDEX, TROPONINI in the last 168 hours.  BNP: Invalid input(s): POCBNP  CBG: No results for input(s): GLUCAP in the last 168 hours.  Microbiology: Results for orders placed or performed during the hospital encounter of 12/18/2018  Culture, respiratory (non-expectorated)     Status: None   Collection Time: 01/05/2019  6:42 PM  Result Value Ref Range Status   Specimen Description TRACHEAL ASPIRATE  Final   Special Requests NONE  Final   Gram Stain   Final    MODERATE WBC PRESENT, PREDOMINANTLY PMN FEW GRAM NEGATIVE RODS Performed at [facility], [address], [place], [place] [address]    Culture   Final    FEW KLEBSIELLA PNEUMONIAE FEW ENTEROBACTER CLOACAE    Report Status 01/06/2019 FINAL  Final   Organism ID, Bacteria KLEBSIELLA PNEUMONIAE  Final   Organism ID, Bacteria ENTEROBACTER CLOACAE  Final  Susceptibility   Enterobacter cloacae - MIC*    CEFAZOLIN >=64 RESISTANT Resistant     CEFEPIME 4 SENSITIVE Sensitive     CEFTAZIDIME >=64 RESISTANT Resistant     CEFTRIAXONE >=64 RESISTANT Resistant     CIPROFLOXACIN <=0.25 SENSITIVE Sensitive     GENTAMICIN <=1 SENSITIVE Sensitive     IMIPENEM 0.5 SENSITIVE Sensitive     TRIMETH/SULFA <=20 SENSITIVE Sensitive     PIP/TAZO >=128 RESISTANT Resistant     * FEW ENTEROBACTER CLOACAE   Klebsiella pneumoniae - MIC*    AMPICILLIN >=32 RESISTANT Resistant     CEFAZOLIN >=64 RESISTANT Resistant     CEFEPIME <=1 SENSITIVE Sensitive     CEFTAZIDIME 32 RESISTANT Resistant     CEFTRIAXONE <=1 SENSITIVE Sensitive     CIPROFLOXACIN <=0.25 SENSITIVE Sensitive     GENTAMICIN <=1 SENSITIVE Sensitive     IMIPENEM <=0.25 SENSITIVE Sensitive     TRIMETH/SULFA <=20 SENSITIVE Sensitive      AMPICILLIN/SULBACTAM RESISTANT Resistant     PIP/TAZO >=128 RESISTANT Resistant     Extended ESBL NEGATIVE Sensitive     * FEW KLEBSIELLA PNEUMONIAE  Culture, respiratory (non-expectorated)     Status: None   Collection Time: 01/12/19 11:03 AM  Result Value Ref Range Status   Specimen Description TRACHEAL ASPIRATE  Final   Special Requests NONE  Final   Gram Stain   Final    ABUNDANT WBC PRESENT,BOTH PMN AND MONONUCLEAR RARE SQUAMOUS EPITHELIAL CELLS PRESENT MODERATE GRAM NEGATIVE RODS FEW GRAM POSITIVE COCCI IN PAIRS FEW GRAM POSITIVE RODS Performed at [facility], [place] [address] [place], [place] [address]    Culture   Final    MODERATE ENTEROBACTER CLOACAE MODERATE KLEBSIELLA PNEUMONIAE MODERATE METHICILLIN RESISTANT STAPHYLOCOCCUS AUREUS    Report Status 01/15/2019 FINAL  Final   Organism ID, Bacteria ENTEROBACTER CLOACAE  Final   Organism ID, Bacteria KLEBSIELLA PNEUMONIAE  Final   Organism ID, Bacteria METHICILLIN RESISTANT STAPHYLOCOCCUS AUREUS  Final      Susceptibility   Enterobacter cloacae - MIC*    CEFAZOLIN >=64 RESISTANT Resistant     CEFEPIME 2 SENSITIVE Sensitive     CEFTAZIDIME >=64 RESISTANT Resistant     CEFTRIAXONE >=64 RESISTANT Resistant     CIPROFLOXACIN <=0.25 SENSITIVE Sensitive     GENTAMICIN <=1 SENSITIVE Sensitive     IMIPENEM <=0.25 SENSITIVE Sensitive     TRIMETH/SULFA <=20 SENSITIVE Sensitive     PIP/TAZO >=128 RESISTANT Resistant     * MODERATE ENTEROBACTER CLOACAE   Klebsiella pneumoniae - MIC*    AMPICILLIN >=32 RESISTANT Resistant     CEFAZOLIN <=4 SENSITIVE Sensitive     CEFEPIME <=1 SENSITIVE Sensitive     CEFTAZIDIME <=1 SENSITIVE Sensitive     CEFTRIAXONE <=1 SENSITIVE Sensitive     CIPROFLOXACIN <=0.25 SENSITIVE Sensitive     GENTAMICIN <=1 SENSITIVE Sensitive     IMIPENEM <=0.25 SENSITIVE Sensitive     TRIMETH/SULFA <=20 SENSITIVE Sensitive     AMPICILLIN/SULBACTAM 8 SENSITIVE Sensitive     PIP/TAZO <=4 SENSITIVE  Sensitive     Extended ESBL NEGATIVE Sensitive     * MODERATE KLEBSIELLA PNEUMONIAE   Methicillin resistant staphylococcus aureus - MIC*    CIPROFLOXACIN <=0.5 SENSITIVE Sensitive     ERYTHROMYCIN 0.5 SENSITIVE Sensitive     GENTAMICIN <=0.5 SENSITIVE Sensitive     OXACILLIN >=4 RESISTANT Resistant     TETRACYCLINE <=1 SENSITIVE Sensitive     VANCOMYCIN <=0.5 SENSITIVE Sensitive  TRIMETH/SULFA <=10 SENSITIVE Sensitive     CLINDAMYCIN <=0.25 SENSITIVE Sensitive     RIFAMPIN <=0.5 SENSITIVE Sensitive     Inducible Clindamycin NEGATIVE Sensitive     * MODERATE METHICILLIN RESISTANT STAPHYLOCOCCUS AUREUS  Culture, Urine     Status: None   Collection Time: 01/18/19  7:55 AM  Result Value Ref Range Status   Specimen Description URINE, RANDOM  Final   Special Requests NONE  Final   Culture   Final    NO GROWTH Performed at [facility], [place] [address], [place], [place] [address]    Report Status 01/19/2019 FINAL  Final  Culture, respiratory     Status: None   Collection Time: 01/18/19  7:57 AM  Result Value Ref Range Status   Specimen Description TRACHEAL ASPIRATE  Final   Special Requests NONE  Final   Gram Stain   Final    FEW WBC PRESENT, PREDOMINANTLY PMN MODERATE BUDDING YEAST SEEN Performed at [facility], [place] [address], [place], [place] [address]    Culture FEW CANDIDA LUSITANIAE  Final   Report Status 01/20/2019 FINAL  Final  Culture, blood (routine x 2)     Status: None   Collection Time: 01/18/19  8:43 AM  Result Value Ref Range Status   Specimen Description BLOOD LEFT HAND  Final   Special Requests   Final    BOTTLES DRAWN AEROBIC ONLY Blood Culture results may not be optimal due to an inadequate volume of blood received in culture bottles   Culture   Final    NO GROWTH 5 DAYS Performed at [facility], [place] [address], [place], [place] [address]    Report Status 01/23/2019 FINAL  Final  Culture, blood (routine x 2)     Status: Abnormal    Collection Time: 01/18/19  8:52 AM  Result Value Ref Range Status   Specimen Description BLOOD LEFT HAND  Final   Special Requests   Final    BOTTLES DRAWN AEROBIC ONLY Blood Culture results may not be optimal due to an inadequate volume of blood received in culture bottles   Culture  Setup Time   Final    GRAM POSITIVE COCCI AEROBIC BOTTLE ONLY CRITICAL RESULT CALLED TO, READ BACK BY AND VERIFIED WITH: Furigay RN 11:20 01/20/19 ([email or web address])    Culture (A)  Final    STAPHYLOCOCCUS SPECIES (COAGULASE NEGATIVE) THE SIGNIFICANCE OF ISOLATING THIS ORGANISM FROM A SINGLE SET OF BLOOD CULTURES WHEN MULTIPLE SETS ARE DRAWN IS UNCERTAIN. PLEASE NOTIFY THE MICROBIOLOGY DEPARTMENT WITHIN ONE WEEK IF SPECIATION AND SENSITIVITIES ARE REQUIRED. Performed at [facility], [place] [address] [place], [place] [address]    Report Status 01/22/2019 FINAL  Final  Blood Culture ID Panel (Reflexed)     Status: Abnormal   Collection Time: 01/18/19  8:52 AM  Result Value Ref Range Status   Enterococcus species NOT DETECTED NOT DETECTED Final   Listeria monocytogenes NOT DETECTED NOT DETECTED Final   Staphylococcus species DETECTED (A) NOT DETECTED Final    Comment: Methicillin (oxacillin) resistant coagulase negative staphylococcus. Possible blood culture contaminant (unless isolated from more than one blood culture draw or clinical case suggests pathogenicity). No antibiotic treatment is indicated for blood  culture contaminants. CRITICAL RESULT CALLED TO, READ BACK BY AND VERIFIED WITH: Furigay RN 11:20 01/20/19 (wilsonm)    Staphylococcus aureus (BCID) NOT DETECTED NOT DETECTED Final   Methicillin resistance DETECTED (A) NOT DETECTED Final    Comment: CRITICAL  RESULT CALLED TO, READ BACK BY AND VERIFIED WITH: Furigay RN 11:20 01/20/19 ([email or web address])    Streptococcus species NOT DETECTED NOT DETECTED Final   Streptococcus agalactiae NOT DETECTED NOT DETECTED Final   Streptococcus pneumoniae NOT DETECTED NOT  DETECTED Final   Streptococcus pyogenes NOT DETECTED NOT DETECTED Final   Acinetobacter baumannii NOT DETECTED NOT DETECTED Final   Enterobacteriaceae species NOT DETECTED NOT DETECTED Final   Enterobacter cloacae complex NOT DETECTED NOT DETECTED Final   Escherichia coli NOT DETECTED NOT DETECTED Final   Klebsiella oxytoca NOT DETECTED NOT DETECTED Final   Klebsiella pneumoniae NOT DETECTED NOT DETECTED Final   Proteus species NOT DETECTED NOT DETECTED Final   Serratia marcescens NOT DETECTED NOT DETECTED Final   Haemophilus influenzae NOT DETECTED NOT DETECTED Final   Neisseria meningitidis NOT DETECTED NOT DETECTED Final   Pseudomonas aeruginosa NOT DETECTED NOT DETECTED Final   Candida albicans NOT DETECTED NOT DETECTED Final   Candida glabrata NOT DETECTED NOT DETECTED Final   Candida krusei NOT DETECTED NOT DETECTED Final   Candida parapsilosis NOT DETECTED NOT DETECTED Final   Candida tropicalis NOT DETECTED NOT DETECTED Final    Comment: Performed at [facility], [address], [place], [place] [address]  Culture, blood (routine x 2)     Status: Abnormal   Collection Time: 01/25/19  7:15 PM  Result Value Ref Range Status   Specimen Description BLOOD RIGHT ARM  Final   Special Requests   Final    BOTTLES DRAWN AEROBIC ONLY Blood Culture adequate volume   Culture  Setup Time   Final    AEROBIC BOTTLE ONLY YEAST IDENTIFICATION TO FOLLOW CRITICAL RESULT CALLED TO, READ BACK BY AND VERIFIED WITHLorelle Formosa RN 01/28/19 0005 JDW Performed at [facility], [place] [address], [place], [place] [address]    Culture CANDIDA ALBICANS CANDIDA PARAPSILOSIS  (A)  Final   Report Status 02/01/2019 FINAL  Final  Culture, blood (routine x 2)     Status: Abnormal   Collection Time: 01/25/19  7:20 PM  Result Value Ref Range Status   Specimen Description BLOOD RIGHT HAND  Final   Special Requests   Final    BOTTLES DRAWN AEROBIC AND ANAEROBIC Blood Culture adequate volume    Culture  Setup Time   Final    YEAST AEROBIC BOTTLE ONLY CRITICAL VALUE NOTED.  VALUE IS CONSISTENT WITH PREVIOUSLY REPORTED AND CALLED VALUE. Performed at [facility], [place] [address], [place], [place] [address]    Culture CANDIDA ALBICANS (A)  Final   Report Status 02/01/2019 FINAL  Final  Blood Culture ID Panel (Reflexed)     Status: Abnormal   Collection Time: 01/25/19  7:21 PM  Result Value Ref Range Status   Enterococcus species NOT DETECTED NOT DETECTED Final   Listeria monocytogenes NOT DETECTED NOT DETECTED Final   Staphylococcus species NOT DETECTED NOT DETECTED Final   Staphylococcus aureus (BCID) NOT DETECTED NOT DETECTED Final   Streptococcus species NOT DETECTED NOT DETECTED Final   Streptococcus agalactiae NOT DETECTED NOT DETECTED Final   Streptococcus pneumoniae NOT DETECTED NOT DETECTED Final   Streptococcus pyogenes NOT DETECTED NOT DETECTED Final   Acinetobacter baumannii NOT DETECTED NOT DETECTED Final   Enterobacteriaceae species NOT DETECTED NOT DETECTED Final   Enterobacter cloacae complex NOT DETECTED NOT DETECTED Final   Escherichia coli NOT DETECTED NOT DETECTED Final   Klebsiella oxytoca NOT DETECTED NOT DETECTED Final   Klebsiella pneumoniae NOT DETECTED  NOT DETECTED Final   Proteus species NOT DETECTED NOT DETECTED Final   Serratia marcescens NOT DETECTED NOT DETECTED Final   Haemophilus influenzae NOT DETECTED NOT DETECTED Final   Neisseria meningitidis NOT DETECTED NOT DETECTED Final   Pseudomonas aeruginosa NOT DETECTED NOT DETECTED Final   Candida albicans DETECTED (A) NOT DETECTED Final    Comment: CRITICAL RESULT CALLED TO, READ BACK BY AND VERIFIED WITH: C MASEKO RN 01/28/19 0005 JDW    Candida glabrata NOT DETECTED NOT DETECTED Final   Candida krusei NOT DETECTED NOT DETECTED Final   Candida parapsilosis DETECTED (A) NOT DETECTED Final    Comment: CRITICAL RESULT CALLED TO, READ BACK BY AND VERIFIED WITH: C MASEKO RN 01/28/19 0005 JDW     Candida tropicalis NOT DETECTED NOT DETECTED Final    Comment: Performed at [facility], [place] [address] [place],  [place] [address]  Respiratory Panel by PCR     Status: None   Collection Time: 01/26/19 12:28 PM  Result Value Ref Range Status   Adenovirus NOT DETECTED NOT DETECTED Final   Coronavirus 229E NOT DETECTED NOT DETECTED Final    Comment: (NOTE) The Coronavirus on the Respiratory Panel, DOES NOT test for the novel  Coronavirus (2019 nCoV)    Coronavirus HKU1 NOT DETECTED NOT DETECTED Final   Coronavirus NL63 NOT DETECTED NOT DETECTED Final   Coronavirus OC43 NOT DETECTED NOT DETECTED Final   Metapneumovirus NOT DETECTED NOT DETECTED Final   Rhinovirus / Enterovirus NOT DETECTED NOT DETECTED Final   Influenza A NOT DETECTED NOT DETECTED Final   Influenza B NOT DETECTED NOT DETECTED Final   Parainfluenza Virus 1 NOT DETECTED NOT DETECTED Final   Parainfluenza Virus 2 NOT DETECTED NOT DETECTED Final   Parainfluenza Virus 3 NOT DETECTED NOT DETECTED Final   Parainfluenza Virus 4 NOT DETECTED NOT DETECTED Final   Respiratory Syncytial Virus NOT DETECTED NOT DETECTED Final   Bordetella pertussis NOT DETECTED NOT DETECTED Final   Chlamydophila pneumoniae NOT DETECTED NOT DETECTED Final   Mycoplasma pneumoniae NOT DETECTED NOT DETECTED Final    Comment: Performed at [facility], [place] [address] [place], [place] [address]  SARS Coronavirus 2 [facility] order, Performed in [facility] hospital lab)     Status: None   Collection Time: 01/27/19  9:25 PM  Result Value Ref Range Status   SARS Coronavirus 2 NEGATIVE NEGATIVE Final    Comment: (NOTE) If result is NEGATIVE SARS-CoV-2 target nucleic acids are NOT DETECTED. The SARS-CoV-2 RNA is generally detectable in upper and lower  respiratory specimens during the acute phase of infection. The lowest  concentration of SARS-CoV-2 viral copies this assay can detect is 250  copies / mL. A negative result does not preclude  SARS-CoV-2 infection  and should not be used as the sole basis for treatment or other  patient management decisions.  A negative result may occur with  improper specimen collection / handling, submission of specimen other  than nasopharyngeal swab, presence of viral mutation(s) within the  areas targeted by this assay, and inadequate number of viral copies  (<250 copies / mL). A negative result must be combined with clinical  observations, patient history, and epidemiological information. If result is POSITIVE SARS-CoV-2 target nucleic acids are DETECTED. The SARS-CoV-2 RNA is generally detectable in upper and lower  respiratory specimens dur ing the acute phase of infection.  Positive  results are indicative of active infection with SARS-CoV-2.  Clinical  correlation with patient history  and other diagnostic information is  necessary to determine patient infection status.  Positive results do  not rule out bacterial infection or co-infection with other viruses. If result is PRESUMPTIVE POSTIVE SARS-CoV-2 nucleic acids MAY BE PRESENT.   A presumptive positive result was obtained on the submitted specimen  and confirmed on repeat testing.  While 2019 novel coronavirus  (SARS-CoV-2) nucleic acids may be present in the submitted sample  additional confirmatory testing may be necessary for epidemiological  and / or clinical management purposes  to differentiate between  SARS-CoV-2 and other Sarbecovirus currently known to infect humans.  If clinically indicated additional testing with an alternate test  methodology [phone]) is advised. The SARS-CoV-2 RNA is generally  detectable in upper and lower respiratory sp ecimens during the acute  phase of infection. The expected result is Negative. Fact Sheet for Patients:  StrictlyIdeas.no Fact Sheet for Healthcare Providers: BankingDealers.co.za This test is not yet approved or cleared by the  [place] FDA and has been authorized for detection and/or diagnosis of SARS-CoV-2 by FDA under an Emergency Use Authorization (EUA).  This EUA will remain in effect (meaning this test can be used) for the duration of the COVID-19 declaration under Section 564(b)(1) of the Act, 21 U.S.C. section 360bbb-3(b)(1), unless the authorization is terminated or revoked sooner. Performed at [facility], [place] [address] [place], [place] [address]   C difficile quick scan w PCR reflex     Status: None   Collection Time: 01/28/19  1:29 PM  Result Value Ref Range Status   C Diff antigen NEGATIVE NEGATIVE Final   C Diff toxin NEGATIVE NEGATIVE Final   C Diff interpretation No C. difficile detected.  Final    Comment: Performed at [facility], [place] [address], [place], [place] [address]    Coagulation Studies: Recent Labs    02/03/19 0724  LABPROT 15.4*  INR 1.2    Urinalysis: No results for input(s): COLORURINE, LABSPEC, PHURINE, GLUCOSEU, HGBUR, BILIRUBINUR, KETONESUR, PROTEINUR, UROBILINOGEN, NITRITE, LEUKOCYTESUR in the last 72 hours.  Invalid input(s): APPERANCEUR    Imaging: Dg Chest Port 1 View  Result Date: 02/03/2019 CLINICAL DATA:  Acute on chronic respiratory failure. EXAM: PORTABLE CHEST 1 VIEW COMPARISON:  Single-view of the chest 01/30/2019 and 01/29/2019. FINDINGS: Tracheostomy tube and right IJ approach central venous catheter are unchanged. Extensive bilateral airspace disease persists and appears unchanged. Cardiac silhouette is largely obscured. No pneumothorax or pleural effusion. IMPRESSION: No notable change in diffuse bilateral airspace disease. Electronically Signed   By: Inge Rise M.D.   On: 02/03/2019 07:41     Medications:       Assessment/ Plan:  78 y.o. male with a PMHx of atrial fibrillation, right eye melanoma, chronic back pain, depression, diabetes mellitus type 2, GERD, hypertension, obstructive sleep apnea, who was admitted to  [facility] on 12/23/2018 for on going treatment of acute respiratory failure.  1.  Acute renal failure, suspect multifactorial with contributions from diuretic usage. 2.  Acute respiratory failure. 3.  Metabolic/respiratory acidosis. 4.  Anemia unspecified.  Plan: Patient remains critically ill and still requiring dialysis treatment as well as ventilatory support.  His overall prognosis remains quite poor.  We will plan for dialysis again tomorrow with attempts at ultrafiltration given significant volume overload.  He continues to require significant ventilatory support with FiO2 of 90%.  Continue to monitor renal parameters.   LOS: 0 Roger Nelson 4/20/20208:46 AM

## 2019-02-03 NOTE — Progress Notes (Addendum)
Pulmonary Critical Care Medicine [place]   PULMONARY CRITICAL CARE SERVICE  PROGRESS NOTE  Date of Service: 02/03/2019  Roger Nelson  Roger Nelson  DOB: [birth year]   DOA: 12/30/2018  Referring Physician: Merton Border, MD   HPI: Roger Nelson is a 78 y.o. male seen for follow up of Acute on Chronic Respiratory Failure.  Patient remains on full support.  His FiO2 has been decreased to 80% but overall his prognosis still remains poor.  He is currently sating well, at 100%  Medications: Reviewed on Rounds  Physical Exam:  Vitals: Pulse 90 respirations 30 BP 137/68 O2 sat 100% temp 97.5  Ventilator Settings ventilator mode AC PC rate of 30 IP 2012 FiO2 80%  . General: Comfortable at this time . Eyes: Grossly normal lids, irises & conjunctiva . ENT: grossly tongue is normal . Neck: no obvious mass . Cardiovascular: S1 S2 normal no gallop . Respiratory: Coarse breath sounds . Abdomen: soft . Skin: no rash seen on limited exam . Musculoskeletal: not rigid . Psychiatric:unable to assess . Neurologic: no seizure no involuntary movements         Lab Data:   Basic Metabolic Panel: Recent Labs  Lab 01/28/19 0812 01/29/19 0642 01/29/19 1729 01/30/19 0735 01/31/19 0603 02/01/19 1125 02/02/19 0530 02/03/19 0724  NA 137 138 137 138 137 139 139 141  K 3.7 3.9 4.0 3.4* 3.4* 3.4* 3.1* 3.7  CL 100 102 102 99 97* 99 98 95*  CO2 19* 19* 18* 21* 30 24 28 30   GLUCOSE 111* 150* 175* 197* 108* 148* 124* 141*  BUN 148* 153* 154* 130* 91* 104* 90* 96*  CREATININE 2.81* 3.04* 2.98* 2.73* 2.00* 2.38* 2.11* 2.44*  CALCIUM 7.8* 7.2* 7.1* 7.2* 7.2* 7.3* 7.1* 7.1*  MG 2.6* 2.7*  --  2.5* 2.3 2.4  --   --   PHOS 7.8* 8.5* 8.6* 7.4* 5.4* 6.2*  --   --     ABG: Recent Labs  Lab 01/28/19 0430 01/30/19 0506 01/31/19 0430 02/01/19 0415 02/02/19 0600  PHART 7.180* 7.130* 7.202* 7.189* 7.264*  PCO2ART 50.5* 64.0* 65.4* 66.6* 60.8*  PO2ART 63.2* 109* 109* 85.9 111*   HCO3 18.6* 20.4 24.7 24.4 26.6  O2SAT 90.7 97.0 97.5 95.0 97.7    Liver Function Tests: Recent Labs  Lab 01/29/19 1729 01/30/19 0735 01/31/19 0603 02/01/19 1125 02/03/19 0724  AST  --   --   --   --  38  ALT  --   --   --   --  24  ALKPHOS  --   --   --   --  192*  BILITOT  --   --   --   --  1.0  PROT  --   --   --   --  4.1*  ALBUMIN 2.0* 1.8* 1.8* 1.7* 1.8*   No results for input(s): LIPASE, AMYLASE in the last 168 hours. No results for input(s): AMMONIA in the last 168 hours.  CBC: Recent Labs  Lab 01/30/19 0735 01/31/19 0603 02/01/19 1125 02/02/19 0530 02/03/19 0724  WBC 16.1* 16.2* 14.4* 17.6* 22.6*  HGB 7.2* 7.0* 7.7* 7.2* 7.0*  HCT 22.9* 21.5* 23.1* 21.8* 22.5*  MCV 93.1 93.1 89.5 89.7 92.2  PLT 63* 43* PLATELET CLUMPS NOTED ON SMEAR, UNABLE TO ESTIMATE 20* 21*    Cardiac Enzymes: No results for input(s): CKTOTAL, CKMB, CKMBINDEX, TROPONINI in the last 168 hours.  BNP (last 3 results) Recent Labs  01/02/19 0613  BNP 604.8*    ProBNP (last 3 results) No results for input(s): PROBNP in the last 8760 hours.  Radiological Exams: Dg Chest Port 1 View  Result Date: 02/03/2019 CLINICAL DATA:  Acute on chronic respiratory failure. EXAM: PORTABLE CHEST 1 VIEW COMPARISON:  Single-view of the chest 01/30/2019 and 01/29/2019. FINDINGS: Tracheostomy tube and right IJ approach central venous catheter are unchanged. Extensive bilateral airspace disease persists and appears unchanged. Cardiac silhouette is largely obscured. No pneumothorax or pleural effusion. IMPRESSION: No notable change in diffuse bilateral airspace disease. Electronically Signed   By: Inge Rise M.D.   On: 02/03/2019 07:41    Assessment/Plan Active Problems:   Acute on chronic respiratory failure with hypoxia (HCC)   Bacterial lobar pneumonia   Cervical myelopathy with cervical radiculopathy   Altered mental status   Chronic atrial fibrillation   Respiratory failure  ([place])   1. Acute on chronic respiratory with hypoxia continue full support this time and wean oxygen as tolerated.  Continue pulmonary secretion management.  Prognosis remains poor. 2. Bacterial lobar pneumonia treated continue to monitor 3. Encephalopathy unchanged continue supportive care 4. Altered mental status unchanged 5. Chronic atrial fibrillation rate controlled   I have personally seen and evaluated the patient, evaluated laboratory and imaging results, formulated the assessment and plan and placed orders. The Patient requires high complexity decision making for assessment and support.  Case was discussed on Rounds with the Respiratory Therapy Staff  Allyne Gee, MD [facility] Pulmonary Critical Care Medicine Sleep Medicine

## 2019-02-04 DIAGNOSIS — M4712 Other spondylosis with myelopathy, cervical region: Secondary | ICD-10-CM | POA: Diagnosis not present

## 2019-02-04 DIAGNOSIS — J9621 Acute and chronic respiratory failure with hypoxia: Secondary | ICD-10-CM | POA: Diagnosis not present

## 2019-02-04 DIAGNOSIS — J159 Unspecified bacterial pneumonia: Secondary | ICD-10-CM | POA: Diagnosis not present

## 2019-02-04 DIAGNOSIS — R4182 Altered mental status, unspecified: Secondary | ICD-10-CM | POA: Diagnosis not present

## 2019-02-04 LAB — AMMONIA: Ammonia: 92 umol/L — ABNORMAL HIGH (ref 9–35)

## 2019-02-04 LAB — DIC (DISSEMINATED INTRAVASCULAR COAGULATION)PANEL
D-Dimer, Quant: 9.49 ug/mL-FEU — ABNORMAL HIGH (ref 0.00–0.50)
Fibrinogen: 253 mg/dL (ref 210–475)
INR: 1.4 — ABNORMAL HIGH (ref 0.8–1.2)
Platelets: 54 10*3/uL — ABNORMAL LOW (ref 150–400)
Prothrombin Time: 16.6 seconds — ABNORMAL HIGH (ref 11.4–15.2)
Smear Review: NONE SEEN
aPTT: 26 seconds (ref 24–36)

## 2019-02-04 LAB — TYPE AND SCREEN
ABO/RH(D): O POS
Antibody Screen: NEGATIVE
Unit division: 0

## 2019-02-04 LAB — CBC WITH DIFFERENTIAL/PLATELET
Basophils Absolute: 0 10*3/uL (ref 0.0–0.1)
Basophils Relative: 0 %
Eosinophils Absolute: 0 10*3/uL (ref 0.0–0.5)
Eosinophils Relative: 0 %
HCT: 25 % — ABNORMAL LOW (ref 39.0–52.0)
Hemoglobin: 8.1 g/dL — ABNORMAL LOW (ref 13.0–17.0)
Lymphocytes Relative: 1 %
Lymphs Abs: 0.3 10*3/uL — ABNORMAL LOW (ref 0.7–4.0)
MCH: 29.2 pg (ref 26.0–34.0)
MCHC: 32.4 g/dL (ref 30.0–36.0)
MCV: 90.3 fL (ref 80.0–100.0)
Monocytes Absolute: 0.2 10*3/uL (ref 0.1–1.0)
Monocytes Relative: 1 %
Neutro Abs: 25.5 10*3/uL — ABNORMAL HIGH (ref 1.7–7.7)
Neutrophils Relative %: 97 %
Platelets: 19 10*3/uL — CL (ref 150–400)
RBC: 2.77 MIL/uL — ABNORMAL LOW (ref 4.22–5.81)
RDW: 17 % — ABNORMAL HIGH (ref 11.5–15.5)
WBC: 26.4 10*3/uL — ABNORMAL HIGH (ref 4.0–10.5)
nRBC: 0.4 % — ABNORMAL HIGH (ref 0.0–0.2)

## 2019-02-04 LAB — PREPARE PLATELET PHERESIS: Unit division: 0

## 2019-02-04 LAB — PROTIME-INR
INR: 1.3 — ABNORMAL HIGH (ref 0.8–1.2)
Prothrombin Time: 15.8 seconds — ABNORMAL HIGH (ref 11.4–15.2)

## 2019-02-04 LAB — COMPREHENSIVE METABOLIC PANEL
ALT: 24 U/L (ref 0–44)
AST: 49 U/L — ABNORMAL HIGH (ref 15–41)
Albumin: 1.8 g/dL — ABNORMAL LOW (ref 3.5–5.0)
Alkaline Phosphatase: 195 U/L — ABNORMAL HIGH (ref 38–126)
Anion gap: 17 — ABNORMAL HIGH (ref 5–15)
BUN: 103 mg/dL — ABNORMAL HIGH (ref 8–23)
CO2: 29 mmol/L (ref 22–32)
Calcium: 7 mg/dL — ABNORMAL LOW (ref 8.9–10.3)
Chloride: 95 mmol/L — ABNORMAL LOW (ref 98–111)
Creatinine, Ser: 2.76 mg/dL — ABNORMAL HIGH (ref 0.61–1.24)
GFR calc Af Amer: 25 mL/min — ABNORMAL LOW (ref >60)
GFR calc non Af Amer: 21 mL/min — ABNORMAL LOW (ref >60)
Glucose, Bld: 138 mg/dL — ABNORMAL HIGH (ref 70–99)
Potassium: 3.9 mmol/L (ref 3.5–5.1)
Sodium: 141 mmol/L (ref 135–145)
Total Bilirubin: 1.4 mg/dL — ABNORMAL HIGH (ref 0.3–1.2)
Total Protein: 4.1 g/dL — ABNORMAL LOW (ref 6.5–8.1)

## 2019-02-04 LAB — BPAM PLATELET PHERESIS
Blood Product Expiration Date: 202004212359
Unit Type and Rh: 5100

## 2019-02-04 LAB — BPAM RBC
Blood Product Expiration Date: 202005012359
ISSUE DATE / TIME: 202004202043
Unit Type and Rh: 5100

## 2019-02-04 LAB — LACTATE DEHYDROGENASE: LDH: 632 U/L — ABNORMAL HIGH (ref 98–192)

## 2019-02-04 LAB — PHOSPHORUS: Phosphorus: 6.6 mg/dL — ABNORMAL HIGH (ref 2.5–4.6)

## 2019-02-04 LAB — HEPARIN INDUCED PLATELET AB (HIT ANTIBODY): Heparin Induced Plt Ab: 0.076 OD (ref 0.000–0.400)

## 2019-02-04 LAB — LACTIC ACID, PLASMA: Lactic Acid, Venous: 2.6 mmol/L (ref 0.5–1.9)

## 2019-02-04 LAB — MAGNESIUM: Magnesium: 2.4 mg/dL (ref 1.7–2.4)

## 2019-02-04 LAB — CORTISOL: Cortisol, Plasma: 12.2 ug/dL

## 2019-02-04 NOTE — Progress Notes (Addendum)
Pulmonary Critical Care Medicine [place]   PULMONARY CRITICAL CARE SERVICE  PROGRESS NOTE  Date of Service: 02/04/2019  [name]  JOA:[id number]  DOB: [birth year]   DOA: 01/10/2019  Referring Physician: Merton Border, MD patient continues on full support on the ventilator.  HPI: Roger Nelson is a 78 y.o. male seen for follow up of Acute on Chronic Respiratory Failure.  Currently requiring 55% FiO2.  Good saturations noted.  Medications: Reviewed on Rounds  Physical Exam:  Vitals: Pulse 114 respirations 31 BP 137/71 O2 sat 96% temp 98.1  Ventilator Settings ventilator mode AC PC rate of 30 inspiratory pressure 20 PEEP of 12 FiO2 35%  . General: Comfortable at this time . Eyes: Grossly normal lids, irises & conjunctiva . ENT: grossly tongue is normal . Neck: no obvious mass . Cardiovascular: S1 S2 normal no gallop . Respiratory: Coarse breath sounds . Abdomen: soft . Skin: no rash seen on limited exam . Musculoskeletal: not rigid . Psychiatric:unable to assess . Neurologic: no seizure no involuntary movements         Lab Data:   Basic Metabolic Panel: Recent Labs  Lab 01/29/19 0642 01/29/19 1729 01/30/19 0735 01/31/19 0603 02/01/19 1125 02/02/19 0530 02/03/19 0724 02/04/19 0500 02/04/19 0734  NA 138 137 138 137 139 139 141 141  --   K 3.9 4.0 3.4* 3.4* 3.4* 3.1* 3.7 3.9  --   CL 102 102 99 97* 99 98 95* 95*  --   CO2 19* 18* 21* 30 24 28 30 29   --   GLUCOSE 150* 175* 197* 108* 148* 124* 141* 138*  --   BUN 153* 154* 130* 91* 104* 90* 96* 103*  --   CREATININE 3.04* 2.98* 2.73* 2.00* 2.38* 2.11* 2.44* 2.76*  --   CALCIUM 7.2* 7.1* 7.2* 7.2* 7.3* 7.1* 7.1* 7.0*  --   MG 2.7*  --  2.5* 2.3 2.4  --   --  2.4  --   PHOS 8.5* 8.6* 7.4* 5.4* 6.2*  --   --   --  6.6*    ABG: Recent Labs  Lab 01/30/19 0506 01/31/19 0430 02/01/19 0415 02/02/19 0600  PHART 7.130* 7.202* 7.189* 7.264*  PCO2ART 64.0* 65.4* 66.6* 60.8*  PO2ART 109* 109*  85.9 111*  HCO3 20.4 24.7 24.4 26.6  O2SAT 97.0 97.5 95.0 97.7    Liver Function Tests: Recent Labs  Lab 01/30/19 0735 01/31/19 0603 02/01/19 1125 02/03/19 0724 02/04/19 0500  AST  --   --   --  38 49*  ALT  --   --   --  24 24  ALKPHOS  --   --   --  192* 195*  BILITOT  --   --   --  1.0 1.4*  PROT  --   --   --  4.1* 4.1*  ALBUMIN 1.8* 1.8* 1.7* 1.8* 1.8*   No results for input(s): LIPASE, AMYLASE in the last 168 hours. Recent Labs  Lab 02/04/19 0500  AMMONIA 92*    CBC: Recent Labs  Lab 01/31/19 0603 02/01/19 1125 02/02/19 0530 02/03/19 0724 02/04/19 0500  WBC 16.2* 14.4* 17.6* 22.6* 26.4*  NEUTROABS  --   --   --   --  25.5*  HGB 7.0* 7.7* 7.2* 7.0* 8.1*  HCT 21.5* 23.1* 21.8* 22.5* 25.0*  MCV 93.1 89.5 89.7 92.2 90.3  PLT 43* PLATELET CLUMPS NOTED ON SMEAR, UNABLE TO ESTIMATE 20* 21* 19*    Cardiac Enzymes: No  results for input(s): CKTOTAL, CKMB, CKMBINDEX, TROPONINI in the last 168 hours.  BNP (last 3 results) Recent Labs    01/02/19 0613  BNP 604.8*    ProBNP (last 3 results) No results for input(s): PROBNP in the last 8760 hours.  Radiological Exams: Dg Chest Port 1 View  Result Date: 02/03/2019 CLINICAL DATA:  Acute on chronic respiratory failure. EXAM: PORTABLE CHEST 1 VIEW COMPARISON:  Single-view of the chest 01/30/2019 and 01/29/2019. FINDINGS: Tracheostomy tube and right IJ approach central venous catheter are unchanged. Extensive bilateral airspace disease persists and appears unchanged. Cardiac silhouette is largely obscured. No pneumothorax or pleural effusion. IMPRESSION: No notable change in diffuse bilateral airspace disease. Electronically Signed   By: Inge Rise M.D.   On: 02/03/2019 07:41    Assessment/Plan Active Problems:   Acute on chronic respiratory failure with hypoxia (HCC)   Bacterial lobar pneumonia   Cervical myelopathy with cervical radiculopathy   Altered mental status   Chronic atrial fibrillation    Respiratory failure ([place])   1. Acute on chronic respiratory failure with hypoxia continue full support at this time and wean oxygen as tolerated.  Continue aggressive pulmonary toilet secretion management.  Prognosis remains poor 2. Bacterial lobar pneumonia treated continue to monitor 3. Encephalopathy unchanged continue supportive care 4. Altered mental status unchanged 5. Chronic atrial fibrillation rate controlled   I have personally seen and evaluated the patient, evaluated laboratory and imaging results, formulated the assessment and plan and placed orders. The Patient requires high complexity decision making for assessment and support.  Case was discussed on Rounds with the Respiratory Therapy Staff  Allyne Gee, MD [facility] Pulmonary Critical Care Medicine Sleep Medicine

## 2019-02-05 DIAGNOSIS — J9621 Acute and chronic respiratory failure with hypoxia: Secondary | ICD-10-CM | POA: Diagnosis not present

## 2019-02-05 DIAGNOSIS — R4182 Altered mental status, unspecified: Secondary | ICD-10-CM | POA: Diagnosis not present

## 2019-02-05 DIAGNOSIS — M4712 Other spondylosis with myelopathy, cervical region: Secondary | ICD-10-CM | POA: Diagnosis not present

## 2019-02-05 DIAGNOSIS — J159 Unspecified bacterial pneumonia: Secondary | ICD-10-CM | POA: Diagnosis not present

## 2019-02-05 LAB — CBC
HCT: 19.5 % — ABNORMAL LOW (ref 39.0–52.0)
Hemoglobin: 6.3 g/dL — CL (ref 13.0–17.0)
MCH: 29.7 pg (ref 26.0–34.0)
MCHC: 32.3 g/dL (ref 30.0–36.0)
MCV: 92 fL (ref 80.0–100.0)
Platelets: 51 10*3/uL — ABNORMAL LOW (ref 150–400)
RBC: 2.12 MIL/uL — ABNORMAL LOW (ref 4.22–5.81)
RDW: 17.2 % — ABNORMAL HIGH (ref 11.5–15.5)
WBC: 19.9 10*3/uL — ABNORMAL HIGH (ref 4.0–10.5)
nRBC: 0.2 % (ref 0.0–0.2)

## 2019-02-05 LAB — BPAM PLATELET PHERESIS
Blood Product Expiration Date: 202004232359
ISSUE DATE / TIME: 202004211816
Unit Type and Rh: 5100

## 2019-02-05 LAB — COMPREHENSIVE METABOLIC PANEL
ALT: 31 U/L (ref 0–44)
AST: 55 U/L — ABNORMAL HIGH (ref 15–41)
Albumin: 1.7 g/dL — ABNORMAL LOW (ref 3.5–5.0)
Alkaline Phosphatase: 174 U/L — ABNORMAL HIGH (ref 38–126)
Anion gap: 12 (ref 5–15)
BUN: 61 mg/dL — ABNORMAL HIGH (ref 8–23)
CO2: 29 mmol/L (ref 22–32)
Calcium: 7.2 mg/dL — ABNORMAL LOW (ref 8.9–10.3)
Chloride: 101 mmol/L (ref 98–111)
Creatinine, Ser: 1.88 mg/dL — ABNORMAL HIGH (ref 0.61–1.24)
GFR calc Af Amer: 40 mL/min — ABNORMAL LOW (ref >60)
GFR calc non Af Amer: 34 mL/min — ABNORMAL LOW (ref >60)
Glucose, Bld: 139 mg/dL — ABNORMAL HIGH (ref 70–99)
Potassium: 3.1 mmol/L — ABNORMAL LOW (ref 3.5–5.1)
Sodium: 142 mmol/L (ref 135–145)
Total Bilirubin: 1.3 mg/dL — ABNORMAL HIGH (ref 0.3–1.2)
Total Protein: 3.8 g/dL — ABNORMAL LOW (ref 6.5–8.1)

## 2019-02-05 LAB — PROTIME-INR
INR: 1.4 — ABNORMAL HIGH (ref 0.8–1.2)
Prothrombin Time: 16.6 seconds — ABNORMAL HIGH (ref 11.4–15.2)

## 2019-02-05 LAB — ANCA TITERS
Atypical P-ANCA titer: <1:20 {titer}
C-ANCA: <1:20 {titer}
P-ANCA: <1:20 {titer}

## 2019-02-05 LAB — ANA: Anti Nuclear Antibody (ANA): NEGATIVE

## 2019-02-05 LAB — PREPARE PLATELET PHERESIS: Unit division: 0

## 2019-02-05 NOTE — Progress Notes (Addendum)
Pulmonary Critical Care Medicine [place]   PULMONARY CRITICAL CARE SERVICE  PROGRESS NOTE  Date of Service: 02/07/2019  Roger Nelson  JSE:[id number]  DOB: Jan 19, 1941   DOA: 12/24/2018  Referring Physician: Merton Border, MD  HPI: Roger Nelson is a 78 y.o. male seen for follow up of Acute on Chronic Respiratory Failure.  Patient remains on full support on the ventilator.  Currently requiring 65% FiO2.  He is afebrile at this time.  Medications: Reviewed on Rounds  Physical Exam:  Vitals: Pulse 98 respirations 31 BP 130/65 O2 sat 94% temp 98.3  Ventilator Settings ventilator mode AC PC rate of 30 IP 20 PEEP of 15 FiO2 65%  . General: Comfortable at this time . Eyes: Grossly normal lids, irises & conjunctiva . ENT: grossly tongue is normal . Neck: no obvious mass . Cardiovascular: S1 S2 normal no gallop . Respiratory: Coarse breath sounds . Abdomen: soft . Skin: no rash seen on limited exam . Musculoskeletal: not rigid . Psychiatric:unable to assess . Neurologic: no seizure no involuntary movements         Lab Data:   Basic Metabolic Panel: Recent Labs  Lab 01/29/19 1729 01/30/19 0735 01/31/19 0603 02/01/19 1125 02/02/19 0530 02/03/19 0724 02/04/19 0500 02/04/19 0734 01/21/2019 0642  NA 137 138 137 139 139 141 141  --  142  K 4.0 3.4* 3.4* 3.4* 3.1* 3.7 3.9  --  3.1*  CL 102 99 97* 99 98 95* 95*  --  101  CO2 18* 21* 30 24 28 30 29   --  29  GLUCOSE 175* 197* 108* 148* 124* 141* 138*  --  139*  BUN 154* 130* 91* 104* 90* 96* 103*  --  61*  CREATININE 2.98* 2.73* 2.00* 2.38* 2.11* 2.44* 2.76*  --  1.88*  CALCIUM 7.1* 7.2* 7.2* 7.3* 7.1* 7.1* 7.0*  --  7.2*  MG  --  2.5* 2.3 2.4  --   --  2.4  --   --   PHOS 8.6* 7.4* 5.4* 6.2*  --   --   --  6.6*  --     ABG: Recent Labs  Lab 01/30/19 0506 01/31/19 0430 02/01/19 0415 02/02/19 0600  PHART 7.130* 7.202* 7.189* 7.264*  PCO2ART 64.0* 65.4* 66.6* 60.8*  PO2ART 109* 109* 85.9 111*  HCO3  20.4 24.7 24.4 26.6  O2SAT 97.0 97.5 95.0 97.7    Liver Function Tests: Recent Labs  Lab 01/31/19 0603 02/01/19 1125 02/03/19 0724 02/04/19 0500 01/31/2019 0642  AST  --   --  38 49* 55*  ALT  --   --  24 24 31   ALKPHOS  --   --  192* 195* 174*  BILITOT  --   --  1.0 1.4* 1.3*  PROT  --   --  4.1* 4.1* 3.8*  ALBUMIN 1.8* 1.7* 1.8* 1.8* 1.7*   No results for input(s): LIPASE, AMYLASE in the last 168 hours. Recent Labs  Lab 02/04/19 0500  AMMONIA 92*    CBC: Recent Labs  Lab 02/01/19 1125 02/02/19 0530 02/03/19 0724 02/04/19 0500 02/04/19 2030 01/29/2019 0642  WBC 14.4* 17.6* 22.6* 26.4*  --  19.9*  NEUTROABS  --   --   --  25.5*  --   --   HGB 7.7* 7.2* 7.0* 8.1*  --  6.3*  HCT 23.1* 21.8* 22.5* 25.0*  --  19.5*  MCV 89.5 89.7 92.2 90.3  --  92.0  PLT PLATELET CLUMPS NOTED ON  SMEAR, UNABLE TO ESTIMATE 20* 21* 19* 54* 51*    Cardiac Enzymes: No results for input(s): CKTOTAL, CKMB, CKMBINDEX, TROPONINI in the last 168 hours.  BNP (last 3 results) Recent Labs    01/02/19 0613  BNP 604.8*    ProBNP (last 3 results) No results for input(s): PROBNP in the last 8760 hours.  Radiological Exams: No results found.  Assessment/Plan Active Problems:   Acute on chronic respiratory failure with hypoxia (HCC)   Bacterial lobar pneumonia   Cervical myelopathy with cervical radiculopathy   Altered mental status   Chronic atrial fibrillation   Respiratory failure ([place])   1. Acute on chronic respiratory failure with hypoxia continue full support at this time and continue to attempt to wean oxygen.  Patient's prognosis remains poor and there is discussion of comfort care however family has not agreed at this time.  Continue pulmonary toilet and secretion management 2. Bacterial lobar pneumonia treated continue to monitor 3. Encephalopathy unchanged continue supportive care 4. Altered mental status unchanged 5. Chronic atrial fibrillation rate controlled   I have  personally seen and evaluated the patient, evaluated laboratory and imaging results, formulated the assessment and plan and placed orders. The Patient requires high complexity decision making for assessment and support.  Case was discussed on Rounds with the Respiratory Therapy Staff  Allyne Gee, MD [facility] Pulmonary Critical Care Medicine Sleep Medicine

## 2019-02-14 DEATH — deceased

## 2021-01-18 IMAGING — DX ABDOMEN - 1 VIEW
1 series · 1 of 1 positions shown · non-contrast
Comparison: CT 07/12/2017

CLINICAL DATA: Peg tube position

EXAM:
ABDOMEN - 1 VIEW

[abdomen kub]
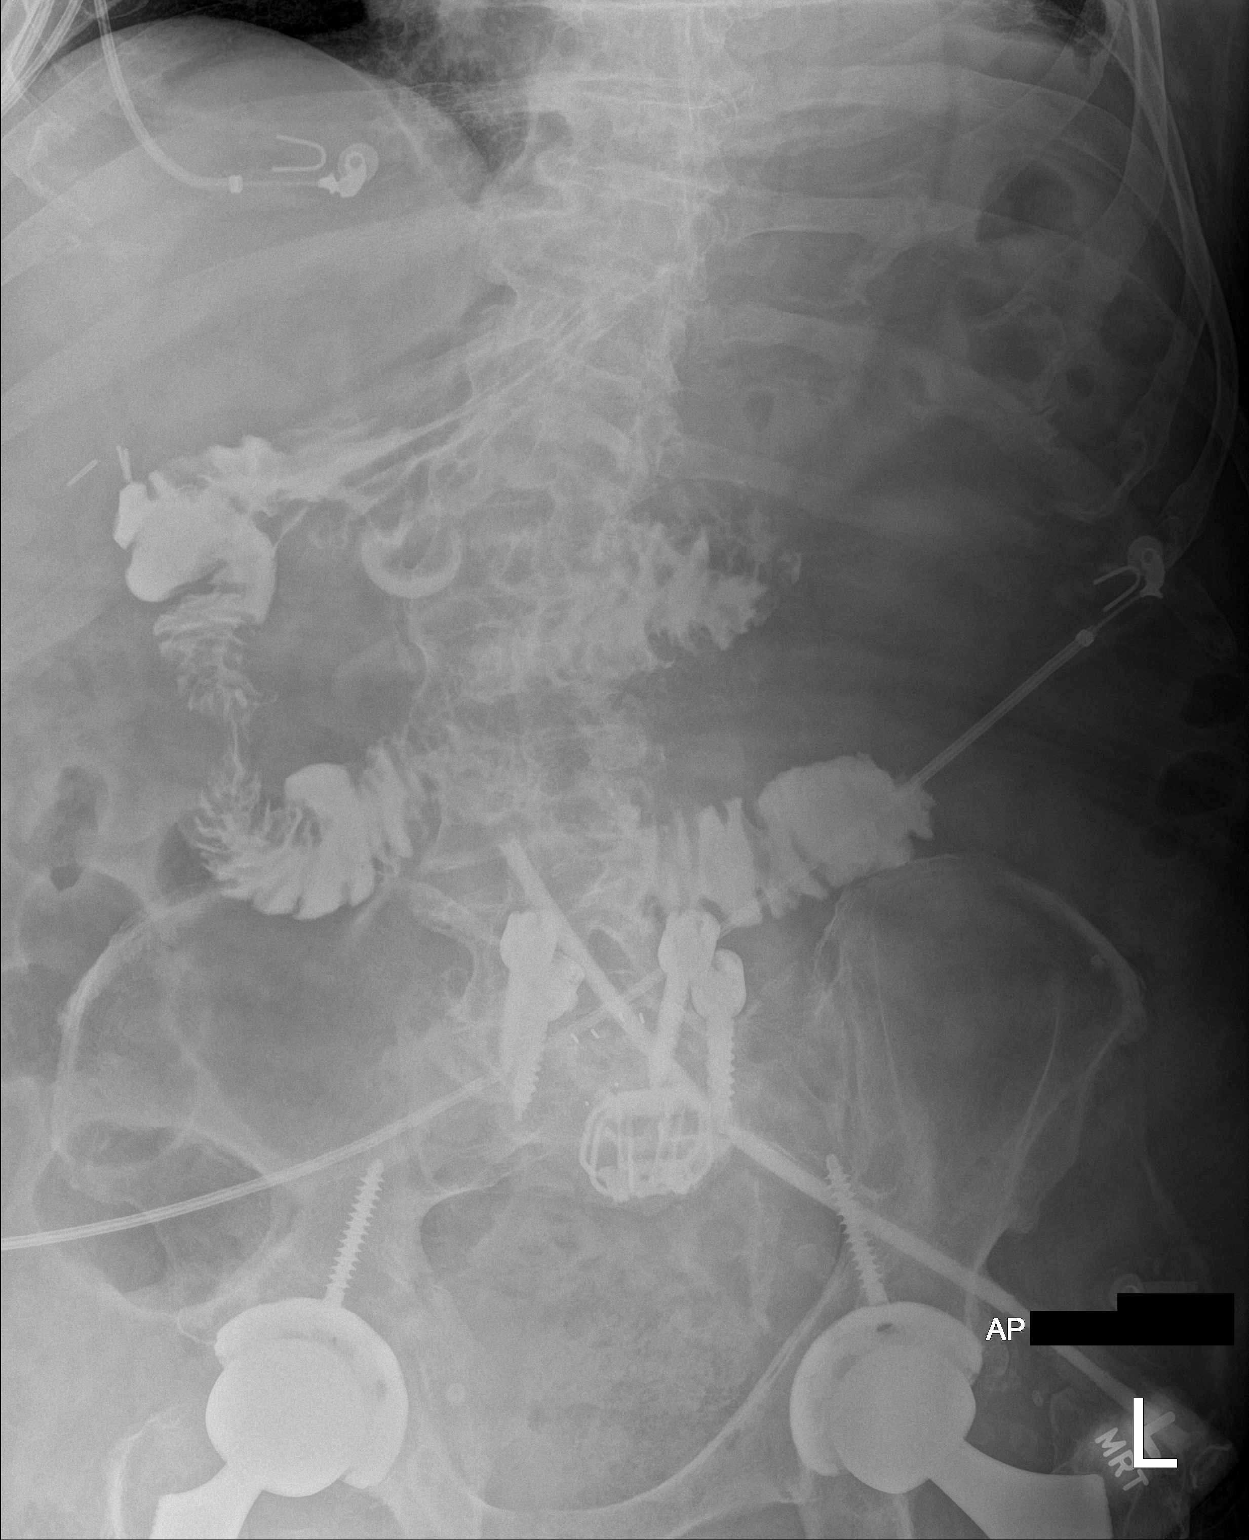

[1 of 1 positions shown; findings below may reference images not displayed]

FINDINGS: 50 cc Omnipaque with injected through the patient's PEG tube.
Radiolucent balloon projects over the distal gastric body. Contrast
opacifies the stomach and proximal small bowel. No extravasation.
Surgical hardware in the spine and hips.
IMPRESSION: Peg tube position within the distal stomach.  No extravasation

## 2021-02-14 IMAGING — US IR ULTRASOUND GUIDANCE VASC ACCESS RIGHT
1 series · 2 of 2 positions shown · non-contrast
Comparison: none

INDICATION: 77-year-old with respiratory distress and tracheostomy tube. Patient
also has acute renal failure and needs a temporary dialysis
catheter. Patient cannot be safely move onto the interventional
table due to respiratory distress. Plan for placement right jugular
central line with ultrasound guidance and will check catheter
positioning with chest radiography.

[Series 1: ir ultrasound guidance vasc access right · 2 of 2 slices shown]
[im 1/2]
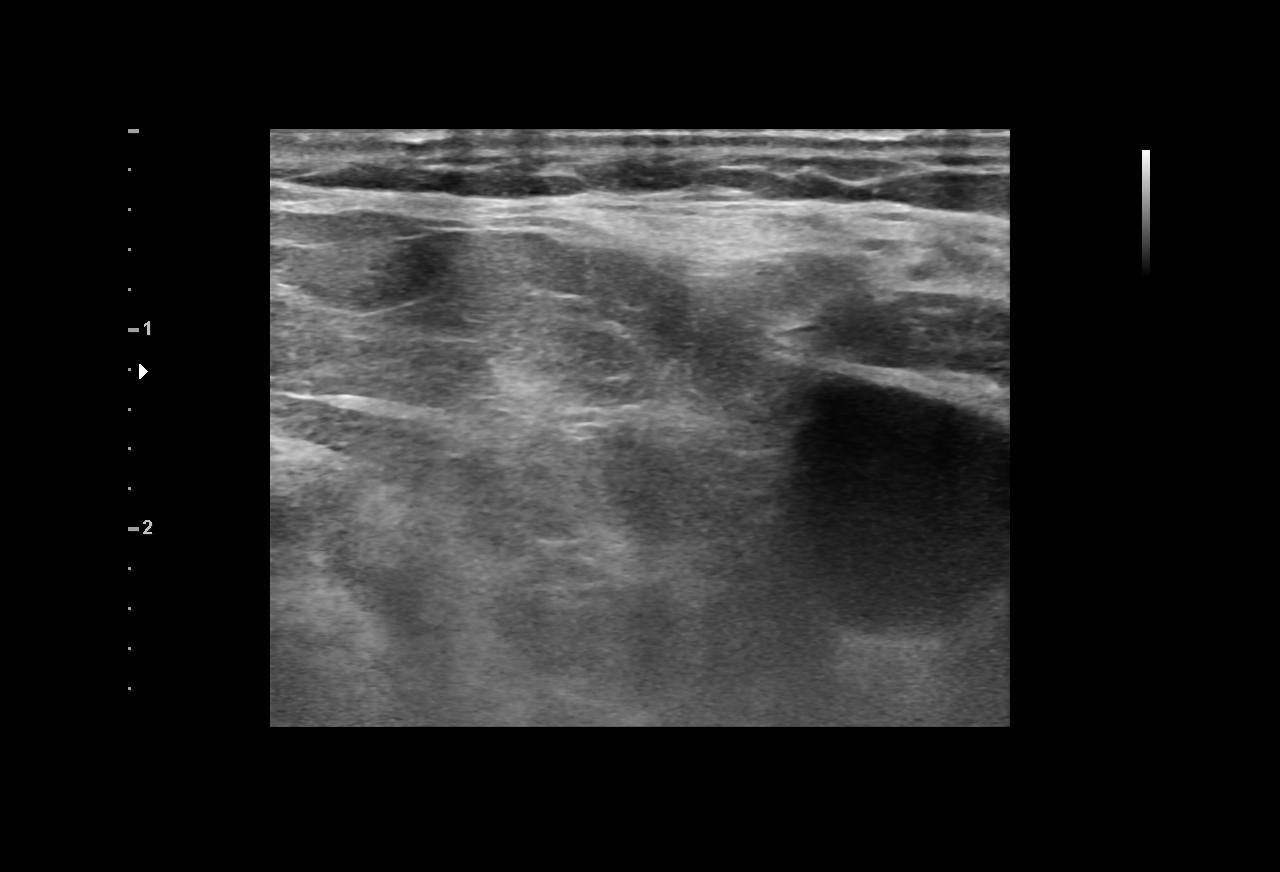
[im 2/2]
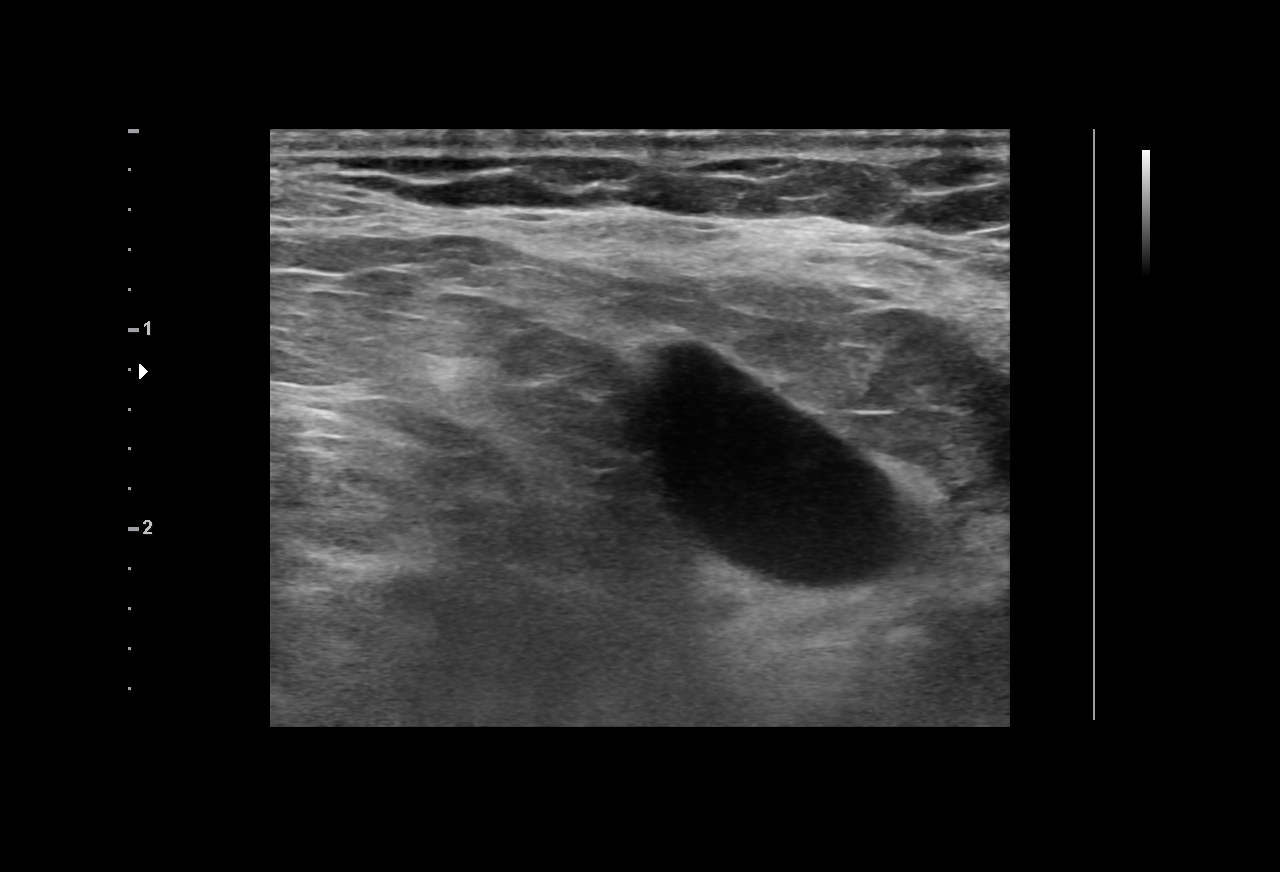

[2 of 2 positions shown; findings below may reference images not displayed]

EXAM:
ULTRASOUND GUIDED PLACEMENT OF A NON-TUNNELED DIALYSIS CATHETER

MEDICATIONS:
None

ANESTHESIA/SEDATION:
None

FLUOROSCOPY TIME:  None

COMPLICATIONS:
None immediate.

PROCEDURE:
Informed consent was obtained for catheter placement. The patient
was placed supine on the interventional table. Ultrasound confirmed
a patent right internal jugular vein. Ultrasound images were
obtained for documentation. The right side of the neck was prepped
and draped in a sterile fashion. The right neck was anesthetized
with 1% lidocaine. Maximal barrier sterile technique was utilized
including caps, mask, sterile gowns, sterile gloves, sterile drape,
hand hygiene and skin antiseptic. A small incision was made with #11
blade scalpel. Needle was directed into the right internal jugular
vein with ultrasound guidance. J wire was easily advanced through
the needle and into the central venous system. Tract was dilated to
accommodate a 16 French Mahurkar catheter. Portable chest x-ray
demonstrated that the catheter was too short and near the upper SVC.
Therefore, the catheter was exchanged over the J-wire for a 20 cm
Mahurkar catheter. Lumens aspirated and flushed well. Appropriate
amount of heparin was placed in the dialysis lumens. Catheter was
sutured to skin. Follow-up chest radiograph was obtained.
FINDINGS: Patent right internal jugular vein. Post placement chest radiograph
demonstrates the catheter tip in the lower SVC region. No evidence
for a large pneumothorax.
IMPRESSION: Successful placement of a right jugular non-tunneled dialysis
catheter using ultrasound guidance.
